# Patient Record
Sex: Male | Born: 1955 | Race: White | Hispanic: No | Marital: Married | Smoking: Former smoker
Health system: Southern US, Community
[De-identification: ages and names within clinical notes are randomized; demographics above are authoritative.]

## PROBLEM LIST (undated history)

## (undated) DIAGNOSIS — K219 Gastro-esophageal reflux disease without esophagitis: Secondary | ICD-10-CM

## (undated) DIAGNOSIS — Z9289 Personal history of other medical treatment: Secondary | ICD-10-CM

## (undated) DIAGNOSIS — I10 Essential (primary) hypertension: Secondary | ICD-10-CM

## (undated) DIAGNOSIS — G4730 Sleep apnea, unspecified: Secondary | ICD-10-CM

## (undated) DIAGNOSIS — R002 Palpitations: Secondary | ICD-10-CM

## (undated) DIAGNOSIS — F410 Panic disorder [episodic paroxysmal anxiety] without agoraphobia: Secondary | ICD-10-CM

## (undated) DIAGNOSIS — Z8719 Personal history of other diseases of the digestive system: Secondary | ICD-10-CM

## (undated) DIAGNOSIS — M1990 Unspecified osteoarthritis, unspecified site: Secondary | ICD-10-CM

## (undated) DIAGNOSIS — K5792 Diverticulitis of intestine, part unspecified, without perforation or abscess without bleeding: Secondary | ICD-10-CM

## (undated) DIAGNOSIS — F40240 Claustrophobia: Secondary | ICD-10-CM

## (undated) DIAGNOSIS — N189 Chronic kidney disease, unspecified: Secondary | ICD-10-CM

## (undated) HISTORY — PX: OTHER SURGICAL HISTORY: SHX169

## (undated) HISTORY — DX: Gastro-esophageal reflux disease without esophagitis: K21.9

## (undated) HISTORY — DX: Essential (primary) hypertension: I10

## (undated) HISTORY — PX: COLONOSCOPY: SHX174

## (undated) HISTORY — PX: NISSEN FUNDOPLICATION: SHX2091

## (undated) HISTORY — PX: APPENDECTOMY: SHX54

---

## 2013-08-26 ENCOUNTER — Ambulatory Visit (INDEPENDENT_AMBULATORY_CARE_PROVIDER_SITE_OTHER): Payer: BC Managed Care – HMO | Admitting: Surgery

## 2013-08-26 ENCOUNTER — Encounter (INDEPENDENT_AMBULATORY_CARE_PROVIDER_SITE_OTHER): Payer: Self-pay | Admitting: Surgery

## 2013-08-26 VITALS — BP 136/82 | HR 80 | Temp 97.5°F | Ht 72.0 in | Wt 202.0 lb

## 2013-08-26 DIAGNOSIS — K219 Gastro-esophageal reflux disease without esophagitis: Secondary | ICD-10-CM | POA: Insufficient documentation

## 2013-08-26 DIAGNOSIS — K449 Diaphragmatic hernia without obstruction or gangrene: Secondary | ICD-10-CM | POA: Insufficient documentation

## 2013-08-26 NOTE — Patient Instructions (Signed)
Will need to get operative note from [facility] for Nissen fundoplication in 1999 Would like to get a copy of the disk that has the UGI study from [facility].

## 2013-08-26 NOTE — Progress Notes (Signed)
Chief Complaint:  Dysphagia following a Nissen fundoplication in 1997 in [name]  History of Present Illness:  Eric Rollins is an 58 y.o. male who [facility] [place]. For what was anticipated to be an overnight stay in the duct and staying in the intensive care unit for 4 days. He was never told caused his ICU stay but he subsequently got along okay and had pain relief of his gastroesophageal reflux complaints. He had an episode of vomiting and retching and felt something tear and began having more problems with reflux. However over the last several months he's Crohn where he cannot eat a few bites and things seemed stacked up and come back on him. [facility] showed barium backing up and a recurrent hiatal hernia. There is a manometry from [facility] shows 70% of his wet swallows did not result in effective peristalsis.  It sounds as if he is got a recurrent hiatal hernia and a wrap at may be twisted causing obstruction and basically an iatrogenic achalasia. We need to get an upper endoscopy and look at things and I want to review his [facility] if possible. If that is not possible then I will repeat it. Also likely need a copy of his operative note from the [facility] in 1999 so we can review that prior to contemplating any interventional surgery.  [facility] is [facility] and [facility] but was referred to me by Levander CampionLacie Armistead, [facility].    Past Medical History  Diagnosis Date  . GERD (gastroesophageal reflux disease)   . Hypertension     Past Surgical History  Procedure Laterality Date  . Appendectomy    . Nissen fundoplication      Current Outpatient Prescriptions  Medication Sig Dispense Refill  . doxazosin (CARDURA) 1 MG tablet       . meloxicam (MOBIC) 7.5 MG tablet       . metoprolol succinate (TOPROL-XL) 100 MG 24  hr tablet       . NEXIUM 40 MG capsule       . triamterene-hydrochlorothiazide (MAXZIDE-25) 37.5-25 MG per tablet        No current facility-administered medications for this visit.   Penicillins History reviewed. No pertinent family history. Social History:   reports that he quit smoking about 23 years ago. His smoking use included Cigarettes. He smoked 0.00 packs per day. He does not have any smokeless tobacco history on file. He reports that he does not drink alcohol or use illicit drugs.   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 Body mass index is 27.39 kg/(m^2).  Gen:  WDWN WM NAD  Neurological: Alert and oriented to person, place, and time. Motor and sensory function is grossly intact  Head: Normocephalic and atraumatic.  Eyes: Conjunctivae are normal. Pupils are equal, round, and reactive to light. No scleral icterus.  Neck: Normal range of motion. Neck supple. No tracheal deviation or thyromegaly present.  Pscyh: Normal mood and affect. Behavior is normal. Judgment and thought content normal.   LABORATORY RESULTS: No results found for this or any previous visit (from the past 48 hour(s)).  RADIOLOGY RESULTS: No  results found.  Problem List: There are no active problems to display for this patient.   Assessment & Plan: Probable recurrent hiatal hernia with partially disrupted wrap producing dysphagia and esophageal dilatationb    [name], MD, [facility]  [facility] [phone] beeper [phone]  08/26/2013 2:12 PM

## 2013-09-15 ENCOUNTER — Encounter (HOSPITAL_COMMUNITY): Payer: Self-pay | Admitting: *Deleted

## 2013-09-15 ENCOUNTER — Ambulatory Visit (HOSPITAL_COMMUNITY)
Admission: RE | Admit: 2013-09-15 | Discharge: 2013-09-15 | Disposition: A | Discharge status: Home / Self Care | Payer: BC Managed Care – PPO | Source: Ambulatory Visit | Attending: Surgery | Admitting: Surgery

## 2013-09-15 ENCOUNTER — Encounter (HOSPITAL_COMMUNITY)
Admission: RE | Disposition: A | Discharge status: Home / Self Care | Payer: Self-pay | Source: Ambulatory Visit | Attending: Surgery

## 2013-09-15 DIAGNOSIS — A048 Other specified bacterial intestinal infections: Secondary | ICD-10-CM | POA: Insufficient documentation

## 2013-09-15 DIAGNOSIS — K219 Gastro-esophageal reflux disease without esophagitis: Secondary | ICD-10-CM | POA: Insufficient documentation

## 2013-09-15 DIAGNOSIS — Z87891 Personal history of nicotine dependence: Secondary | ICD-10-CM | POA: Insufficient documentation

## 2013-09-15 DIAGNOSIS — Z79899 Other long term (current) drug therapy: Secondary | ICD-10-CM | POA: Insufficient documentation

## 2013-09-15 DIAGNOSIS — K449 Diaphragmatic hernia without obstruction or gangrene: Secondary | ICD-10-CM | POA: Insufficient documentation

## 2013-09-15 DIAGNOSIS — R1310 Dysphagia, unspecified: Secondary | ICD-10-CM

## 2013-09-15 DIAGNOSIS — I10 Essential (primary) hypertension: Secondary | ICD-10-CM | POA: Insufficient documentation

## 2013-09-15 HISTORY — DX: Personal history of other diseases of the digestive system: Z87.19

## 2013-09-15 HISTORY — DX: Unspecified osteoarthritis, unspecified site: M19.90

## 2013-09-15 HISTORY — PX: ESOPHAGOGASTRODUODENOSCOPY: SHX5428

## 2013-09-15 HISTORY — DX: Sleep apnea, unspecified: G47.30

## 2013-09-15 SURGERY — EGD (ESOPHAGOGASTRODUODENOSCOPY)
Anesthesia: Moderate Sedation

## 2013-09-15 MED ORDER — BUTAMBEN-TETRACAINE-BENZOCAINE 2-2-14 % EX AERO
INHALATION_SPRAY | CUTANEOUS | Status: DC | PRN
  Administered 2013-09-15: 2 via TOPICAL

## 2013-09-15 MED ORDER — MIDAZOLAM HCL 10 MG/2ML IJ SOLN
INTRAMUSCULAR | Status: AC
  Filled 2013-09-15: qty 4

## 2013-09-15 MED ORDER — MIDAZOLAM HCL 10 MG/2ML IJ SOLN
INTRAMUSCULAR | Status: DC | PRN
  Administered 2013-09-15: 2 mg via INTRAVENOUS
  Administered 2013-09-15: 1 mg via INTRAVENOUS
  Administered 2013-09-15: 2 mg via INTRAVENOUS
  Administered 2013-09-15: 1 mg via INTRAVENOUS

## 2013-09-15 MED ORDER — SODIUM CHLORIDE 0.9 % IV SOLN
INTRAVENOUS | Status: DC
  Administered 2013-09-15: 500 mL via INTRAVENOUS

## 2013-09-15 MED ORDER — FENTANYL CITRATE 0.05 MG/ML IJ SOLN
INTRAMUSCULAR | Status: DC | PRN
  Administered 2013-09-15 (×3): 25 ug via INTRAVENOUS

## 2013-09-15 MED ORDER — FENTANYL CITRATE 0.05 MG/ML IJ SOLN
INTRAMUSCULAR | Status: AC
  Filled 2013-09-15: qty 4

## 2013-09-15 NOTE — Discharge Instructions (Signed)
Gastrointestinal Endoscopy Care After Refer to this sheet in the next few weeks. These instructions provide you with information on caring for yourself after your procedure. Your caregiver may also give you more specific instructions. Your treatment has been planned according to current medical practices, but problems sometimes occur. Call your caregiver if you have any problems or questions after your procedure. HOME CARE INSTRUCTIONS  If you were given medicine to help you relax (sedative), do not drive, operate machinery, or sign important documents for 24 hours.  Avoid alcohol and hot or warm beverages for the first 24 hours after the procedure.  Only take over-the-counter or prescription medicines for pain, discomfort, or fever as directed by your caregiver. You may resume taking your normal medicines unless your caregiver tells you otherwise. Ask your caregiver when you may resume taking medicines that may cause bleeding, such as aspirin, clopidogrel, or warfarin.  You may return to your normal diet and activities on the day after your procedure, or as directed by your caregiver. Walking may help to reduce any bloated feeling in your abdomen.  Drink enough fluids to keep your urine clear or pale yellow.  You may gargle with salt water if you have a sore throat. SEEK IMMEDIATE MEDICAL CARE IF:  You have severe nausea or vomiting.  You have severe abdominal pain, abdominal cramps that last longer than 6 hours, or abdominal swelling (distention).  You have severe shoulder or back pain.  You have trouble swallowing.  You have shortness of breath, your breathing is shallow, or you are breathing faster than normal.  You have a fever or a rapid heartbeat.  You vomit blood or material that looks like coffee grounds.  You have bloody, black, or tarry stools. MAKE SURE YOU:  Understand these instructions.  Will watch your condition.  Will get help right away if you are not doing  well or get worse. Document Released: 11/15/2003 Document Revised: 10/02/2011 Document Reviewed: 07/03/2011 [facility] Patient Information 2014 [place], [place]. Diet for Gastroesophageal Reflux Disease, Adult Reflux (acid reflux) is when acid from your stomach flows up into the esophagus. When acid comes in contact with the esophagus, the acid causes irritation and soreness (inflammation) in the esophagus. When reflux happens often or so severely that it causes damage to the esophagus, it is called gastroesophageal reflux disease (GERD). Nutrition therapy can help ease the discomfort of GERD. FOODS OR DRINKS TO AVOID OR LIMIT  Smoking or chewing tobacco. Nicotine is one of the most potent stimulants to acid production in the gastrointestinal tract.  Caffeinated and decaffeinated coffee and black tea.  Regular or low-calorie carbonated beverages or energy drinks (caffeine-free carbonated beverages are allowed).   Strong spices, such as black pepper, white pepper, red pepper, cayenne, curry powder, and chili powder.  Peppermint or spearmint.  Chocolate.  High-fat foods, including meats and fried foods. Extra added fats including oils, butter, salad dressings, and nuts. Limit these to less than 8 tsp per day.  Fruits and vegetables if they are not tolerated, such as citrus fruits or tomatoes.  Alcohol.  Any food that seems to aggravate your condition. If you have questions regarding your diet, call your caregiver or a registered dietitian. OTHER THINGS THAT MAY HELP GERD INCLUDE:   Eating your meals slowly, in a relaxed setting.  Eating 5 to 6 small meals per day instead of 3 large meals.  Eliminating food for a period of time if it causes distress.  Not lying down until 3 hours  after eating a meal.  Keeping the head of your bed raised 6 to 9 inches (15 to 23 cm) by using a foam wedge or blocks under the legs of the bed. Lying flat may make symptoms worse.  Being physically active.  Weight loss may be helpful in reducing reflux in overweight or obese adults.  Wear loose fitting clothing EXAMPLE MEAL PLAN This meal plan is approximately 2,000 calories based on https://www.bernard.org/ meal planning guidelines. Breakfast   cup cooked oatmeal.  1 cup strawberries.  1 cup low-fat milk.  1 oz almonds. Snack  1 cup cucumber slices.  6 oz yogurt (made from low-fat or fat-free milk). Lunch  2 slice whole-wheat bread.  2 oz sliced [place].  2 tsp mayonnaise.  1 cup blueberries.  1 cup snap peas. Snack  6 whole-wheat crackers.  1 oz string cheese. Dinner   cup brown rice.  1 cup mixed veggies.  1 tsp olive oil.  3 oz grilled fish. Document Released: 04/02/2005 Document Revised: 06/25/2011 Document Reviewed: 02/16/2011 [facility] Patient Information 2014 [place], [place]. Gastroesophageal Reflux Disease, Adult Gastroesophageal reflux disease (GERD) happens when acid from your stomach goes into your food pipe (esophagus). The acid can cause a burning feeling in your chest. Over time, the acid can make small holes (ulcers) in your food pipe.  HOME CARE  Ask your doctor for advice about:  Losing weight.  Quitting smoking.  Alcohol use.  Avoid foods and drinks that make your problems worse. You may want to avoid:  Caffeine and alcohol.  Chocolate.  Mints.  Garlic and onions.  Spicy foods.  Citrus fruits, such as oranges, lemons, or limes.  Foods that contain tomato, such as sauce, chili, salsa, and pizza.  Fried and fatty foods.  Avoid lying down for 3 hours before you go to bed or before you take a nap.  Eat small meals often, instead of large meals.  Wear loose-fitting clothing. Do not wear anything tight around your waist.  Raise (elevate) the head of your bed 6 to 8 inches with wood blocks. Using extra pillows does not help.  Only take medicines as told by your doctor.  Do not take aspirin or ibuprofen. GET HELP RIGHT AWAY IF:     You have pain in your arms, neck, jaw, teeth, or back.  Your pain gets worse or changes.  You feel sick to your stomach (nauseous), throw up (vomit), or sweat (diaphoresis).  You feel short of breath, or you pass out (faint).  Your throw up is green, yellow, black, or looks like coffee grounds or blood.  Your poop (stool) is red, bloody, or black. MAKE SURE YOU:   Understand these instructions.  Will watch your condition.  Will get help right away if you are not doing well or get worse. Document Released: 09/19/2007 Document Revised: 06/25/2011 Document Reviewed: 10/20/2010 [facility] Patient Information 2014 [place], [place].

## 2013-09-15 NOTE — H&P (View-Only) (Signed)
Chief Complaint:  Dysphagia following a Nissen fundoplication in 1997 in [name]  History of Present Illness:  Eric Rollins is an 58 y.o. male who [facility] [place] For what was anticipated to be an overnight stay in the duct and staying in the intensive care unit for 4 days. He was never told caused his ICU stay but he subsequently got along okay and had pain relief of his gastroesophageal reflux complaints. He had an episode of vomiting and retching and felt something tear and began having more problems with reflux. However over the last several months he's Crohn where he cannot eat a few bites and things seemed stacked up and come back on him. [facility] showed barium backing up and a recurrent hiatal hernia. There is a manometry from [facility] shows 70% of his wet swallows did not result in effective peristalsis.  It sounds as if he is got a recurrent hiatal hernia and a wrap at may be twisted causing obstruction and basically an iatrogenic achalasia. We need to get an upper endoscopy and look at things and I want to review [facility] if possible. If that is not possible then I will repeat it. Also likely need a copy of his operative note from the [facility] Hospital in 1999 so we can review that prior to contemplating any interventional surgery.  [facility] is [facility] and [facility] but was referred to me by [name], CRNA at [facility].    Past Medical History  Diagnosis Date  . GERD (gastroesophageal reflux disease)   . Hypertension     Past Surgical History  Procedure Laterality Date  . Appendectomy    . Nissen fundoplication      Current Outpatient Prescriptions  Medication Sig Dispense Refill  . doxazosin (CARDURA) 1 MG tablet       . meloxicam (MOBIC) 7.5 MG tablet       . metoprolol succinate (TOPROL-XL) 100 MG 24  hr tablet       . NEXIUM 40 MG capsule       . triamterene-hydrochlorothiazide (MAXZIDE-25) 37.5-25 MG per tablet        No current facility-administered medications for this visit.   Penicillins History reviewed. No pertinent family history. Social History:   reports that he quit smoking about 23 years ago. His smoking use included Cigarettes. He smoked 0.00 packs per day. He does not have any smokeless tobacco history on file. He reports that he does not drink alcohol or use illicit drugs.   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 Body mass index is 27.39 kg/(m^2).  Gen:  WDWN WM NAD  Neurological: Alert and oriented to person, place, and time. Motor and sensory function is grossly intact  Head: Normocephalic and atraumatic.  Eyes: Conjunctivae are normal. Pupils are equal, round, and reactive to light. No scleral icterus.  Neck: Normal range of motion. Neck supple. No tracheal deviation or thyromegaly present.  Pscyh: Normal mood and affect. Behavior is normal. Judgment and thought content normal.   LABORATORY RESULTS: No results found for this or any previous visit (from the past 48 hour(s)).  RADIOLOGY RESULTS: No  results found.  Problem List: There are no active problems to display for this patient.   Assessment & Plan: Probable recurrent hiatal hernia with partially disrupted wrap producing dysphagia and esophageal dilatationb    [name], MD, [facility]  [facility] [phone] beeper [phone]  08/26/2013 2:12 PM

## 2013-09-15 NOTE — Interval H&P Note (Signed)
History and Physical Interval Note:  09/15/2013 7:37 AM  Eric Rollins  has presented today for surgery, with the diagnosis of dysphagia after nissen   The various methods of treatment have been discussed with the patient and family.  The patient has his wife with him.  His biggest complaint is increased difficulty swallowing, but he has not lost weight.  After consideration of risks, benefits and other options for treatment, the patient has consented to  Procedure(s): ESOPHAGOGASTRODUODENOSCOPY (EGD) (N/A) as a surgical intervention .  The patient's history has been reviewed, patient examined, no change in status, stable for surgery.  I have reviewed the patient's chart and labs.  Questions were answered to the patient's satisfaction.     [name]

## 2013-09-15 NOTE — Op Note (Addendum)
09/15/2013  8:07 AM  PATIENT:  Eric Rollins, 58 y.o., male, MRN: [id number]  PREOP DIAGNOSIS:  History of prior hiatal hernia repair with dysphagia  POSTOP DIAGNOSIS:   "Slipped" hiatal hernia repair, EG junction at 35 cm, Diaphragmatic hiatus at 40 cm, mildly dilated distal esophagus  [photos taken]  PROCEDURE:  Esophagogastroduedonoscopy  SURGEON:   Ovidio Kin, M.D.  ANESTHESIA:   Fentanyl  75 mcg   Versed 6 mg  INDICATIONS FOR PROCEDURE:  Eric Rollins is a 58 y.o. (DOB: 1955/06/30)  white  male whose primary care physician is Hope Pigeon B and comes for upper endoscopy to evaluate a recurrent hiatal hernia.  He had a prior hiatal hernia repair around 1997 in [place].  He did well until about 3 years ago when he has noticed increasing dysphagia.  He comes for upper endo to evaluate his prior repair.   The indications and risks of the endoscopy were explained to the patient.  The risks include, but are not limited to, perforation, bleeding, or injury to the bowel.  PROCEDURE:  The patient was monitored with a pulse oximetry, BP cuff, and EKG.  The patient has nasal O2 flowing during the procedure.   The back of the throat was anesthestized with Ceticaine.  A flexible Pentax endoscope was passed down the throat without difficulty.  Findings include:   Esophagus:   Normal, except becomes mildly dilated distally as it approaches the Z line.  This would be consistent with partial obstruction at the diaphragmatic hiatus.  The diaphragmatic hiatus though is widely patent.  He had this constant burping while I was infusing air in the esophagus, which disappeared when I advanced the scope into the stomach.   GE junction at:  35 cm.  He has an approximate 5 cm hiatal hernia with the diaphragmatic hiatus at about 40 cm.  The diaphragmatic hiatus is widely patent (at least the opening width of twice the endoscope diameter).   Stomach: Some bland cobbling of the proximal stomach (does it  slide through the hiatus ?).  I did a biopsy for CLO.  Normal antrum and pylorus   Duodenum:   Normal  PLAN:  Return to see Dr. Daphine Deutscher to discuss the options of surgery.  Ovidio Kin, MD, [facility] Pager: [phone] Office phone:  [phone]

## 2013-09-16 LAB — CLOTEST (H. PYLORI), BIOPSY: Helicobacter screen: NEGATIVE

## 2013-09-17 ENCOUNTER — Encounter (HOSPITAL_COMMUNITY): Payer: Self-pay | Admitting: Surgery

## 2013-09-23 ENCOUNTER — Ambulatory Visit (INDEPENDENT_AMBULATORY_CARE_PROVIDER_SITE_OTHER): Payer: BC Managed Care – HMO | Admitting: Surgery

## 2013-09-23 VITALS — Temp 98.0°F | Resp 18 | Ht 72.0 in | Wt 198.0 lb

## 2013-09-23 DIAGNOSIS — K929 Disease of digestive system, unspecified: Secondary | ICD-10-CM

## 2013-09-23 DIAGNOSIS — K9189 Other postprocedural complications and disorders of digestive system: Secondary | ICD-10-CM

## 2013-09-23 DIAGNOSIS — K219 Gastro-esophageal reflux disease without esophagitis: Secondary | ICD-10-CM

## 2013-09-23 NOTE — Progress Notes (Signed)
Chief Complaint:  Dysphagia following a Nissen fundoplication in 1997 in [name]  History of Present Illness:  Eric Rollins is an 58 y.o. male who underwent a Nissen fundoplication in 1997 at [facility] in [place]. For what was anticipated to be an overnight stay in the hospital turned in to a 4 day stay in  intensive care.  I reviewed his operative note and there was no indication of any problem with the Nissen fundoplication. He was never told caused his ICU stay but he subsequently got along okay and had pain relief of his gastroesophageal reflux complaints. He had an episode of vomiting and retching and felt something tear and began having more problems with reflux. However over the last several months he's gotten where he cannot eat a few bites and things seemed stacked up and come back on him. [facility] showed barium backing up and a recurrent hiatal hernia. There is a manometry from [facility] shows 70% of his wet swallows did not result in effective peristalsis.  It sounds as if he is got a recurrent hiatal hernia and a wrap at may be twisted causing obstruction and basically an iatrogenic achalasia. We need to get an upper endoscopy and look at things and I want to review his [facility] if possible. If that is not possible then I will repeat it. Also likely need a copy of his operative note from the regional Hospital in 1999 so we can review that prior to contemplating any interventional surgery.  [facility] is [facility] and [facility] but was referred to me by [name], CRNA at [facility].    A reviewed his upper endoscopy with Dr. Ezzard Standing who performed. It would appear that his wrap has herniated above his diaphragm. I discussed recurrent surgery with him and he would like to get that done although his wife is facing some pending severe [facility]  I think we will try set up a redo Nissen fundoplication in early August I will get Dr. Ezzard Standing to assist me.  Past Medical History  Diagnosis Date  . GERD (gastroesophageal reflux disease)   . Hypertension   . Murmur   . Pneumonia   . Arthritis     in back  . H/O hiatal hernia   . Sleep apnea     sleep apenia surgery that was unsuccessful    Past Surgical History  Procedure Laterality Date  . Appendectomy    . Nissen fundoplication    . Esophagogastroduodenoscopy N/A 09/15/2013    Procedure: ESOPHAGOGASTRODUODENOSCOPY (EGD);  Surgeon: [name], MD;  Location: [facility];  Service: General;  Laterality: N/A;    Current Outpatient Prescriptions  Medication Sig Dispense Refill  . doxazosin (CARDURA) 1 MG tablet       . ibuprofen (ADVIL,MOTRIN) 200 MG tablet Take 200 mg by mouth 4 (four) times daily.      . meloxicam (MOBIC) 7.5 MG tablet       . metoprolol succinate (TOPROL-XL) 100 MG 24 hr tablet       . NEXIUM 40 MG capsule       . triamterene-hydrochlorothiazide (MAXZIDE-25) 37.5-25 MG per tablet        No current facility-administered medications for this visit.   Lisinopril; Oxycodone; and Penicillins No family history on file. Social History:   reports that he quit smoking about 23  years ago. His smoking use included Cigarettes. He smoked 0.00 packs per day. He has never used smokeless tobacco. He reports that he does not drink alcohol or use illicit drugs.   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 rate 18, height 6' (1.829 m), weight 198 lb (89.812 kg). Body mass index is 26.85 kg/(m^2).  Gen:  WDWN WM NAD  Neurological: Alert and oriented to person, place, and time. Motor and sensory function is grossly intact  Head: Normocephalic and atraumatic.  Eyes: Conjunctivae are normal. Pupils are equal, round, and reactive to light. No  scleral icterus.  Neck: Normal range of motion. Neck supple. No tracheal deviation or thyromegaly present.  Pscyh: Normal mood and affect. Behavior is normal. Judgment and thought content normal.   LABORATORY RESULTS: No results found for this or any previous visit (from the past 48 hour(s)).  RADIOLOGY RESULTS: No results found.  Problem List: Patient Active Problem List   Diagnosis Date Noted  . GERD (gastroesophageal reflux disease) 08/26/2013  . Hiatus hernia syndrome 08/26/2013    Assessment & Plan: Probable recurrent hiatal hernia with partially disrupted wrap producing dysphagia and esophageal dilatation.  Plan redo laparoscopic Nissen fundoplication.      [name], MD, [facility]  [facility] [phone] beeper [phone]  09/23/2013 1:16 PM

## 2013-09-25 DIAGNOSIS — K9189 Other postprocedural complications and disorders of digestive system: Secondary | ICD-10-CM | POA: Insufficient documentation

## 2013-09-29 ENCOUNTER — Encounter (INDEPENDENT_AMBULATORY_CARE_PROVIDER_SITE_OTHER): Payer: Self-pay

## 2013-10-09 ENCOUNTER — Encounter (INDEPENDENT_AMBULATORY_CARE_PROVIDER_SITE_OTHER): Payer: BC Managed Care – HMO | Admitting: Surgery

## 2013-10-23 ENCOUNTER — Encounter (INDEPENDENT_AMBULATORY_CARE_PROVIDER_SITE_OTHER): Payer: BC Managed Care – HMO | Admitting: Surgery

## 2013-11-20 ENCOUNTER — Encounter (HOSPITAL_COMMUNITY): Payer: Self-pay | Admitting: Pharmacy Technician

## 2013-11-25 NOTE — Patient Instructions (Addendum)
Eric Rollins  11/25/2013                           YOUR PROCEDURE IS SCHEDULED ON: 12/04/13               ENTER THRU [facility] MAIN [facility]OSPITAL ENTRANCE AND                            FOLLOW  SIGNS TO SHORT STAY CENTER                 ARRIVE AT SHORT STAY AT: 5:30 AM               CALL THIS NUMBER IF ANY PROBLEMS THE DAY OF SURGERY :               [phone]                                REMEMBER:   Do not eat food or drink liquids AFTER MIDNIGHT              USE FLEET ENEMA THE NIGHT BEFORE SURGERY              STOP ALL ASPIRIN AND HERBAL MEDS 5 DAYS PREOP               BRING ASV MACHINE TO HOSPITAL             Take these medicines the morning of surgery with               A SIPS OF WATER :  LORAZEPAM / METOPROLOL / NEXIUM       Do not wear jewelry, make-up   Do not wear lotions, powders, or perfumes.   Do not shave legs or underarms 12 hrs. before surgery (men may shave face)  Do not bring valuables to the hospital.  Contacts, dentures or bridgework may not be worn into surgery.  Leave suitcase in the car. After surgery it may be brought to your room.  For patients admitted to the hospital more than one night, checkout time is            11:00 AM                                                         ________________________________________________________________________                                                                        [facility] - PREPARING FOR SURGERY  Before surgery, you can play an important role.  Because skin is not sterile, your skin needs to be as free of germs as possible.  You can reduce the number of germs on your skin by washing with CHG (chlorahexidine gluconate) soap before surgery.  CHG is an antiseptic cleaner which kills germs and bonds with the skin to continue killing germs even  after washing. Please DO NOT use if you have an allergy to CHG or antibacterial soaps.  If your skin becomes reddened/irritated stop using  the CHG and inform your nurse when you arrive at Short Stay. Do not shave (including legs and underarms) for at least 48 hours prior to the first CHG shower.  You may shave your face. Please follow these instructions carefully:   1.  Shower with CHG Soap the night before surgery and the  morning of Surgery.   2.  If you choose to wash your hair, wash your hair first as usual with your  normal  Shampoo.   3.  After you shampoo, rinse your hair and body thoroughly to remove the  shampoo.                                         4.  Use CHG as you would any other liquid soap.  You can apply chg directly  to the skin and wash . Gently wash with scrungie or clean wascloth    5.  Apply the CHG Soap to your body ONLY FROM THE NECK DOWN.   Do not use on open                           Wound or open sores. Avoid contact with eyes, ears mouth and genitals (private parts).                        Genitals (private parts) with your normal soap.              6.  Wash thoroughly, paying special attention to the area where your surgery  will be performed.   7.  Thoroughly rinse your body with warm water from the neck down.   8.  DO NOT shower/wash with your normal soap after using and rinsing off  the CHG Soap .                9.  Pat yourself dry with a clean towel.             10.  Wear clean pajamas.             11.  Place clean sheets on your bed the night of your first shower and do not  sleep with pets.  Day of Surgery : Do not apply any lotions/deodorants the morning of surgery.  Please wear clean clothes to the hospital/surgery center.  FAILURE TO FOLLOW THESE INSTRUCTIONS MAY RESULT IN THE CANCELLATION OF YOUR SURGERY    PATIENT SIGNATURE_________________________________  ______________________________________________________________________

## 2013-11-26 ENCOUNTER — Encounter (HOSPITAL_COMMUNITY): Payer: Self-pay

## 2013-11-26 ENCOUNTER — Encounter (HOSPITAL_COMMUNITY)
Admission: RE | Admit: 2013-11-26 | Discharge: 2013-11-26 | Disposition: A | Discharge status: Home / Self Care | Payer: BC Managed Care – PPO | Source: Ambulatory Visit | Attending: Surgery | Admitting: Surgery

## 2013-11-26 DIAGNOSIS — Z01810 Encounter for preprocedural cardiovascular examination: Secondary | ICD-10-CM | POA: Insufficient documentation

## 2013-11-26 DIAGNOSIS — Z01818 Encounter for other preprocedural examination: Secondary | ICD-10-CM | POA: Insufficient documentation

## 2013-11-26 DIAGNOSIS — I498 Other specified cardiac arrhythmias: Secondary | ICD-10-CM | POA: Insufficient documentation

## 2013-11-26 DIAGNOSIS — Z01812 Encounter for preprocedural laboratory examination: Secondary | ICD-10-CM | POA: Diagnosis not present

## 2013-11-26 HISTORY — DX: Claustrophobia: F40.240

## 2013-11-26 HISTORY — DX: Chronic kidney disease, unspecified: N18.9

## 2013-11-26 HISTORY — DX: Palpitations: R00.2

## 2013-11-26 HISTORY — DX: Panic disorder (episodic paroxysmal anxiety): F41.0

## 2013-11-26 HISTORY — DX: Diverticulitis of intestine, part unspecified, without perforation or abscess without bleeding: K57.92

## 2013-11-26 LAB — BASIC METABOLIC PANEL
Anion gap: 12 (ref 5–15)
BUN: 14 mg/dL (ref 6–23)
CO2: 29 mEq/L (ref 19–32)
Calcium: 9.1 mg/dL (ref 8.4–10.5)
Chloride: 97 mEq/L (ref 96–112)
Creatinine, Ser: 1.51 mg/dL — ABNORMAL HIGH (ref 0.50–1.35)
GFR calc Af Amer: 58 mL/min — ABNORMAL LOW (ref >90)
GFR calc non Af Amer: 50 mL/min — ABNORMAL LOW (ref >90)
Glucose, Bld: 97 mg/dL (ref 70–99)
Potassium: 3.6 mEq/L — ABNORMAL LOW (ref 3.7–5.3)
Sodium: 138 mEq/L (ref 137–147)

## 2013-11-26 LAB — CBC
HCT: 40.7 % (ref 39.0–52.0)
Hemoglobin: 14.3 g/dL (ref 13.0–17.0)
MCH: 31.4 pg (ref 26.0–34.0)
MCHC: 35.1 g/dL (ref 30.0–36.0)
MCV: 89.3 fL (ref 78.0–100.0)
PLATELETS: 175 10*3/uL (ref 150–400)
RBC: 4.56 MIL/uL (ref 4.22–5.81)
RDW: 12.1 % (ref 11.5–15.5)
WBC: 4.5 10*3/uL (ref 4.0–10.5)

## 2013-11-27 NOTE — Progress Notes (Signed)
Abnormal BMET faxed to Dr. Daphine DeutscherMartin

## 2013-12-04 ENCOUNTER — Encounter (HOSPITAL_COMMUNITY): Payer: BC Managed Care – PPO | Admitting: *Deleted

## 2013-12-04 ENCOUNTER — Encounter (HOSPITAL_COMMUNITY): Payer: Self-pay | Admitting: *Deleted

## 2013-12-04 ENCOUNTER — Inpatient Hospital Stay (HOSPITAL_COMMUNITY)
Admission: RE | Admit: 2013-12-04 | Discharge: 2013-12-06 | DRG: 328 | Disposition: A | Discharge status: Home / Self Care | Payer: BC Managed Care – PPO | Source: Ambulatory Visit | Attending: Surgery | Admitting: Surgery

## 2013-12-04 ENCOUNTER — Encounter (HOSPITAL_COMMUNITY)
Admission: RE | Disposition: A | Discharge status: Home / Self Care | Payer: Self-pay | Source: Ambulatory Visit | Attending: Surgery

## 2013-12-04 ENCOUNTER — Inpatient Hospital Stay (HOSPITAL_COMMUNITY): Payer: BC Managed Care – PPO | Admitting: *Deleted

## 2013-12-04 DIAGNOSIS — K449 Diaphragmatic hernia without obstruction or gangrene: Secondary | ICD-10-CM | POA: Diagnosis present

## 2013-12-04 DIAGNOSIS — Y838 Other surgical procedures as the cause of abnormal reaction of the patient, or of later complication, without mention of misadventure at the time of the procedure: Secondary | ICD-10-CM | POA: Diagnosis present

## 2013-12-04 DIAGNOSIS — Z6826 Body mass index (BMI) 26.0-26.9, adult: Secondary | ICD-10-CM | POA: Diagnosis not present

## 2013-12-04 DIAGNOSIS — F410 Panic disorder [episodic paroxysmal anxiety] without agoraphobia: Secondary | ICD-10-CM | POA: Diagnosis present

## 2013-12-04 DIAGNOSIS — Z79899 Other long term (current) drug therapy: Secondary | ICD-10-CM | POA: Diagnosis not present

## 2013-12-04 DIAGNOSIS — G4730 Sleep apnea, unspecified: Secondary | ICD-10-CM | POA: Diagnosis present

## 2013-12-04 DIAGNOSIS — Z01812 Encounter for preprocedural laboratory examination: Secondary | ICD-10-CM

## 2013-12-04 DIAGNOSIS — F411 Generalized anxiety disorder: Secondary | ICD-10-CM | POA: Diagnosis present

## 2013-12-04 DIAGNOSIS — R1310 Dysphagia, unspecified: Secondary | ICD-10-CM

## 2013-12-04 DIAGNOSIS — Z9889 Other specified postprocedural states: Secondary | ICD-10-CM

## 2013-12-04 DIAGNOSIS — Z87891 Personal history of nicotine dependence: Secondary | ICD-10-CM | POA: Diagnosis not present

## 2013-12-04 DIAGNOSIS — K929 Disease of digestive system, unspecified: Principal | ICD-10-CM | POA: Diagnosis present

## 2013-12-04 DIAGNOSIS — K219 Gastro-esophageal reflux disease without esophagitis: Secondary | ICD-10-CM | POA: Diagnosis present

## 2013-12-04 DIAGNOSIS — N189 Chronic kidney disease, unspecified: Secondary | ICD-10-CM | POA: Diagnosis present

## 2013-12-04 DIAGNOSIS — I10 Essential (primary) hypertension: Secondary | ICD-10-CM | POA: Diagnosis present

## 2013-12-04 HISTORY — PX: LAPAROSCOPIC NISSEN FUNDOPLICATION: SHX1932

## 2013-12-04 LAB — CREATININE, SERUM
CREATININE: 1.34 mg/dL (ref 0.50–1.35)
GFR calc Af Amer: 67 mL/min — ABNORMAL LOW (ref >90)
GFR calc non Af Amer: 58 mL/min — ABNORMAL LOW (ref >90)

## 2013-12-04 LAB — CBC
HCT: 40 % (ref 39.0–52.0)
Hemoglobin: 14 g/dL (ref 13.0–17.0)
MCH: 31.5 pg (ref 26.0–34.0)
MCHC: 35 g/dL (ref 30.0–36.0)
MCV: 89.9 fL (ref 78.0–100.0)
PLATELETS: 153 10*3/uL (ref 150–400)
RBC: 4.45 MIL/uL (ref 4.22–5.81)
RDW: 12.2 % (ref 11.5–15.5)
WBC: 9.8 10*3/uL (ref 4.0–10.5)

## 2013-12-04 SURGERY — FUNDOPLICATION, NISSEN, LAPAROSCOPIC
Anesthesia: General

## 2013-12-04 MED ORDER — DEXTROSE 5 % IV SOLN
2.0000 g | INTRAVENOUS | Status: AC
  Administered 2013-12-04: 2 g via INTRAVENOUS

## 2013-12-04 MED ORDER — METOCLOPRAMIDE HCL 5 MG/ML IJ SOLN
INTRAMUSCULAR | Status: DC | PRN
  Administered 2013-12-04: 10 mg via INTRAVENOUS

## 2013-12-04 MED ORDER — LIDOCAINE HCL (CARDIAC) 20 MG/ML IV SOLN
INTRAVENOUS | Status: DC | PRN
  Administered 2013-12-04: 50 mg via INTRAVENOUS

## 2013-12-04 MED ORDER — NEOSTIGMINE METHYLSULFATE 10 MG/10ML IV SOLN
INTRAVENOUS | Status: AC
  Filled 2013-12-04: qty 1

## 2013-12-04 MED ORDER — EPHEDRINE SULFATE 50 MG/ML IJ SOLN
INTRAMUSCULAR | Status: DC | PRN
  Administered 2013-12-04: 10 mg via INTRAVENOUS
  Administered 2013-12-04: 5 mg via INTRAVENOUS

## 2013-12-04 MED ORDER — LIDOCAINE HCL (CARDIAC) 20 MG/ML IV SOLN
INTRAVENOUS | Status: AC
  Filled 2013-12-04: qty 5

## 2013-12-04 MED ORDER — FLEET ENEMA 7-19 GM/118ML RE ENEM: 1.0000 | ENEMA | Freq: Once | RECTAL | Status: DC

## 2013-12-04 MED ORDER — ONDANSETRON HCL 4 MG PO TABS: 4.0000 mg | ORAL_TABLET | Freq: Four times a day (QID) | ORAL | Status: DC | PRN

## 2013-12-04 MED ORDER — GLYCOPYRROLATE 0.2 MG/ML IJ SOLN
INTRAMUSCULAR | Status: DC | PRN
  Administered 2013-12-04: 0.2 mg via INTRAVENOUS
  Administered 2013-12-04: .8 mg via INTRAVENOUS

## 2013-12-04 MED ORDER — LORAZEPAM 2 MG/ML IJ SOLN
0.5000 mg | INTRAMUSCULAR | Status: DC | PRN
  Administered 2013-12-04 – 2013-12-05 (×3): 0.5 mg via INTRAVENOUS
  Filled 2013-12-04 (×3): qty 1

## 2013-12-04 MED ORDER — NEOSTIGMINE METHYLSULFATE 10 MG/10ML IV SOLN
INTRAVENOUS | Status: DC | PRN
  Administered 2013-12-04: 5 mg via INTRAVENOUS

## 2013-12-04 MED ORDER — LACTATED RINGERS IV SOLN
INTRAVENOUS | Status: DC | PRN
  Administered 2013-12-04: 1000 mL

## 2013-12-04 MED ORDER — ONDANSETRON HCL 4 MG/2ML IJ SOLN
INTRAMUSCULAR | Status: DC | PRN
  Administered 2013-12-04: 4 mg via INTRAVENOUS

## 2013-12-04 MED ORDER — SUCCINYLCHOLINE CHLORIDE 20 MG/ML IJ SOLN
INTRAMUSCULAR | Status: DC | PRN
  Administered 2013-12-04: 100 mg via INTRAVENOUS

## 2013-12-04 MED ORDER — SUFENTANIL CITRATE 50 MCG/ML IV SOLN
INTRAVENOUS | Status: DC | PRN
  Administered 2013-12-04: 5 ug via INTRAVENOUS
  Administered 2013-12-04 (×2): 10 ug via INTRAVENOUS
  Administered 2013-12-04 (×6): 5 ug via INTRAVENOUS
  Administered 2013-12-04: 10 ug via INTRAVENOUS

## 2013-12-04 MED ORDER — LACTATED RINGERS IV SOLN
INTRAVENOUS | Status: DC | PRN
  Administered 2013-12-04 (×3): via INTRAVENOUS

## 2013-12-04 MED ORDER — PROPOFOL 10 MG/ML IV BOLUS
INTRAVENOUS | Status: DC | PRN
  Administered 2013-12-04: 220 mg via INTRAVENOUS

## 2013-12-04 MED ORDER — CEFOXITIN SODIUM 2 G IV SOLR
INTRAVENOUS | Status: AC
  Filled 2013-12-04: qty 2

## 2013-12-04 MED ORDER — KCL IN DEXTROSE-NACL 20-5-0.45 MEQ/L-%-% IV SOLN
INTRAVENOUS | Status: DC
  Administered 2013-12-04 – 2013-12-05 (×2): via INTRAVENOUS
  Administered 2013-12-05: 100 mL/h via INTRAVENOUS
  Administered 2013-12-06 (×2): via INTRAVENOUS
  Filled 2013-12-04 (×5): qty 1000

## 2013-12-04 MED ORDER — ONDANSETRON HCL 4 MG/2ML IJ SOLN: 4.0000 mg | Freq: Four times a day (QID) | INTRAMUSCULAR | Status: DC | PRN

## 2013-12-04 MED ORDER — SODIUM CHLORIDE 0.9 % IJ SOLN
INTRAMUSCULAR | Status: AC
  Filled 2013-12-04: qty 20

## 2013-12-04 MED ORDER — PROMETHAZINE HCL 25 MG/ML IJ SOLN
6.2500 mg | INTRAMUSCULAR | Status: DC | PRN
  Administered 2013-12-04: 6.25 mg via INTRAVENOUS

## 2013-12-04 MED ORDER — SUFENTANIL CITRATE 50 MCG/ML IV SOLN
INTRAVENOUS | Status: AC
  Filled 2013-12-04: qty 1

## 2013-12-04 MED ORDER — PROMETHAZINE HCL 25 MG/ML IJ SOLN
INTRAMUSCULAR | Status: AC
  Filled 2013-12-04: qty 1

## 2013-12-04 MED ORDER — SODIUM CHLORIDE 0.9 % IJ SOLN: 9.0000 mL | INTRAMUSCULAR | Status: DC | PRN

## 2013-12-04 MED ORDER — PROPOFOL 10 MG/ML IV BOLUS
INTRAVENOUS | Status: AC
  Filled 2013-12-04: qty 20

## 2013-12-04 MED ORDER — CHLORHEXIDINE GLUCONATE 0.12 % MT SOLN
15.0000 mL | Freq: Two times a day (BID) | OROMUCOSAL | Status: DC
  Administered 2013-12-04 – 2013-12-06 (×5): 15 mL via OROMUCOSAL
  Filled 2013-12-04 (×7): qty 15

## 2013-12-04 MED ORDER — CHLORHEXIDINE GLUCONATE 4 % EX LIQD: 1.0000 "application " | Freq: Once | CUTANEOUS | Status: DC

## 2013-12-04 MED ORDER — MIDAZOLAM HCL 2 MG/2ML IJ SOLN
INTRAMUSCULAR | Status: AC
  Filled 2013-12-04: qty 2

## 2013-12-04 MED ORDER — HYDROMORPHONE 0.3 MG/ML IV SOLN
INTRAVENOUS | Status: AC
  Filled 2013-12-04: qty 25

## 2013-12-04 MED ORDER — DIPHENHYDRAMINE HCL 50 MG/ML IJ SOLN: 12.5000 mg | Freq: Four times a day (QID) | INTRAMUSCULAR | Status: DC | PRN

## 2013-12-04 MED ORDER — HEPARIN SODIUM (PORCINE) 5000 UNIT/ML IJ SOLN
5000.0000 [IU] | Freq: Three times a day (TID) | INTRAMUSCULAR | Status: DC
  Administered 2013-12-04 – 2013-12-06 (×5): 5000 [IU] via SUBCUTANEOUS
  Filled 2013-12-04 (×9): qty 1

## 2013-12-04 MED ORDER — PHENYLEPHRINE HCL 10 MG/ML IJ SOLN
INTRAMUSCULAR | Status: DC | PRN
  Administered 2013-12-04: 80 ug via INTRAVENOUS

## 2013-12-04 MED ORDER — CISATRACURIUM BESYLATE (PF) 10 MG/5ML IV SOLN
INTRAVENOUS | Status: DC | PRN
  Administered 2013-12-04: 6 mg via INTRAVENOUS
  Administered 2013-12-04 (×2): 4 mg via INTRAVENOUS

## 2013-12-04 MED ORDER — FENTANYL CITRATE 0.05 MG/ML IJ SOLN
INTRAMUSCULAR | Status: AC
  Filled 2013-12-04: qty 2

## 2013-12-04 MED ORDER — BUPIVACAINE LIPOSOME 1.3 % IJ SUSP
20.0000 mL | Freq: Once | INTRAMUSCULAR | Status: AC
Start: 2013-12-04 — End: 2013-12-04
  Administered 2013-12-04: 20 mL
  Filled 2013-12-04: qty 20

## 2013-12-04 MED ORDER — GLYCOPYRROLATE 0.2 MG/ML IJ SOLN
INTRAMUSCULAR | Status: AC
  Filled 2013-12-04: qty 4

## 2013-12-04 MED ORDER — CETYLPYRIDINIUM CHLORIDE 0.05 % MT LIQD
7.0000 mL | Freq: Two times a day (BID) | OROMUCOSAL | Status: DC
  Administered 2013-12-04 – 2013-12-05 (×2): 7 mL via OROMUCOSAL

## 2013-12-04 MED ORDER — HYDROMORPHONE HCL PF 1 MG/ML IJ SOLN
INTRAMUSCULAR | Status: DC | PRN
  Administered 2013-12-04 (×4): 0.5 mg via INTRAVENOUS

## 2013-12-04 MED ORDER — LACTATED RINGERS IV SOLN: INTRAVENOUS | Status: DC

## 2013-12-04 MED ORDER — FENTANYL CITRATE 0.05 MG/ML IJ SOLN
25.0000 ug | INTRAMUSCULAR | Status: DC | PRN
  Administered 2013-12-04 (×2): 25 ug via INTRAVENOUS

## 2013-12-04 MED ORDER — NALOXONE HCL 0.4 MG/ML IJ SOLN: 0.4000 mg | INTRAMUSCULAR | Status: DC | PRN

## 2013-12-04 MED ORDER — ONDANSETRON HCL 4 MG/2ML IJ SOLN
INTRAMUSCULAR | Status: AC
  Filled 2013-12-04: qty 2

## 2013-12-04 MED ORDER — 0.9 % SODIUM CHLORIDE (POUR BTL) OPTIME
TOPICAL | Status: DC | PRN
  Administered 2013-12-04: 1000 mL

## 2013-12-04 MED ORDER — HYDROMORPHONE 0.3 MG/ML IV SOLN
INTRAVENOUS | Status: DC
  Administered 2013-12-04: 0.2 mg via INTRAVENOUS
  Administered 2013-12-04: 0.599 mg via INTRAVENOUS
  Administered 2013-12-04: 12:00:00 via INTRAVENOUS
  Administered 2013-12-05: 0.599 mg via INTRAVENOUS
  Administered 2013-12-05: 0.2 mg via INTRAVENOUS
  Administered 2013-12-05: 0.4 mg via INTRAVENOUS
  Administered 2013-12-06: 0.19 mg via INTRAVENOUS
  Administered 2013-12-06: 0.4 mg via INTRAVENOUS

## 2013-12-04 MED ORDER — MIDAZOLAM HCL 5 MG/5ML IJ SOLN
INTRAMUSCULAR | Status: DC | PRN
  Administered 2013-12-04: 1 mg via INTRAVENOUS
  Administered 2013-12-04: 2 mg via INTRAVENOUS
  Administered 2013-12-04: 1 mg via INTRAVENOUS

## 2013-12-04 MED ORDER — MEPERIDINE HCL 50 MG/ML IJ SOLN: 6.2500 mg | INTRAMUSCULAR | Status: DC | PRN

## 2013-12-04 MED ORDER — DEXAMETHASONE SODIUM PHOSPHATE 10 MG/ML IJ SOLN
INTRAMUSCULAR | Status: DC | PRN
  Administered 2013-12-04: 10 mg via INTRAVENOUS

## 2013-12-04 MED ORDER — SODIUM CHLORIDE 0.9 % IJ SOLN
INTRAMUSCULAR | Status: AC
  Filled 2013-12-04: qty 10

## 2013-12-04 MED ORDER — HYDROMORPHONE HCL PF 2 MG/ML IJ SOLN
INTRAMUSCULAR | Status: AC
  Filled 2013-12-04: qty 1

## 2013-12-04 MED ORDER — LORAZEPAM BOLUS VIA INFUSION: 0.5000 mg | INTRAVENOUS | Status: DC | PRN

## 2013-12-04 MED ORDER — DIPHENHYDRAMINE HCL 12.5 MG/5ML PO ELIX: 12.5000 mg | ORAL_SOLUTION | Freq: Four times a day (QID) | ORAL | Status: DC | PRN

## 2013-12-04 MED ORDER — HEPARIN SODIUM (PORCINE) 5000 UNIT/ML IJ SOLN
5000.0000 [IU] | Freq: Once | INTRAMUSCULAR | Status: AC
  Administered 2013-12-04: 5000 [IU] via SUBCUTANEOUS
  Filled 2013-12-04: qty 1

## 2013-12-04 MED ORDER — ROCURONIUM BROMIDE 100 MG/10ML IV SOLN
INTRAVENOUS | Status: DC | PRN
  Administered 2013-12-04: 5 mg via INTRAVENOUS

## 2013-12-04 MED ORDER — EPHEDRINE SULFATE 50 MG/ML IJ SOLN
INTRAMUSCULAR | Status: AC
  Filled 2013-12-04: qty 1

## 2013-12-04 SURGICAL SUPPLY — 48 items
APPLIER CLIP ROT 10 11.4 M/L (STAPLE)
BENZOIN TINCTURE PRP APPL 2/3 (GAUZE/BANDAGES/DRESSINGS) IMPLANT
CABLE HIGH FREQUENCY MONO STRZ (ELECTRODE) IMPLANT
CLAMP ENDO BABCK 10MM (STAPLE) IMPLANT
CLIP APPLIE ROT 10 11.4 M/L (STAPLE) IMPLANT
CLOSURE WOUND 1/2 X4 (GAUZE/BANDAGES/DRESSINGS)
DECANTER SPIKE VIAL GLASS SM (MISCELLANEOUS) ×3 IMPLANT
DEVICE SUT QUICK LOAD TK 5 (STAPLE) ×12 IMPLANT
DEVICE SUT TI-KNOT TK 5X26 (MISCELLANEOUS) ×2 IMPLANT
DEVICE SUTURE ENDOST 10MM (ENDOMECHANICALS) IMPLANT
DEVICE TI KNOT TK5 (MISCELLANEOUS) ×1
DISSECTOR BLUNT TIP ENDO 5MM (MISCELLANEOUS) ×3 IMPLANT
DRAIN PENROSE 18X1/2 LTX STRL (DRAIN) ×3 IMPLANT
DRAPE LAPAROSCOPIC ABDOMINAL (DRAPES) ×3 IMPLANT
ELECT REM PT RETURN 9FT ADLT (ELECTROSURGICAL) ×3
ELECTRODE REM PT RTRN 9FT ADLT (ELECTROSURGICAL) ×1 IMPLANT
FILTER SMOKE EVAC LAPAROSHD (FILTER) IMPLANT
GLOVE BIOGEL M 8.0 STRL (GLOVE) ×3 IMPLANT
GOWN SPEC L4 XLG W/TWL (GOWN DISPOSABLE) ×6 IMPLANT
GOWN STRL REUS W/TWL XL LVL3 (GOWN DISPOSABLE) ×9 IMPLANT
GRASPER ENDO BABCOCK 10 (MISCELLANEOUS) IMPLANT
GRASPER ENDO BABCOCK 10MM (MISCELLANEOUS)
KIT BASIN OR (CUSTOM PROCEDURE TRAY) ×3 IMPLANT
PENCIL BUTTON HOLSTER BLD 10FT (ELECTRODE) IMPLANT
QUICK LOAD TK 5 (STAPLE) ×6
SCISSORS LAP 5X45 EPIX DISP (ENDOMECHANICALS) ×3 IMPLANT
SET IRRIG TUBING LAPAROSCOPIC (IRRIGATION / IRRIGATOR) ×3 IMPLANT
SHEARS HARMONIC ACE PLUS 45CM (MISCELLANEOUS) ×3 IMPLANT
SLEEVE ADV FIXATION 12X100MM (TROCAR) IMPLANT
SLEEVE ADV FIXATION 5X100MM (TROCAR) ×3 IMPLANT
SOLUTION ANTI FOG 6CC (MISCELLANEOUS) ×3 IMPLANT
STAPLER VISISTAT 35W (STAPLE) ×6 IMPLANT
STRIP CLOSURE SKIN 1/2X4 (GAUZE/BANDAGES/DRESSINGS) IMPLANT
SUT SURGIDAC NAB ES-9 0 48 120 (SUTURE) ×18 IMPLANT
SUT VIC AB 4-0 SH 18 (SUTURE) ×3 IMPLANT
TIP INNERVISION DETACH 40FR (MISCELLANEOUS) IMPLANT
TIP INNERVISION DETACH 50FR (MISCELLANEOUS) IMPLANT
TIP INNERVISION DETACH 56FR (MISCELLANEOUS) ×3 IMPLANT
TIPS INNERVISION DETACH 40FR (MISCELLANEOUS)
TOWEL OR 17X26 10 PK STRL BLUE (TOWEL DISPOSABLE) ×6 IMPLANT
TOWEL OR NON WOVEN STRL DISP B (DISPOSABLE) ×3 IMPLANT
TRAY FOLEY CATH 14FRSI W/METER (CATHETERS) IMPLANT
TRAY FOLEY CATH 16FRSI W/METER (SET/KITS/TRAYS/PACK) ×3 IMPLANT
TRAY LAP CHOLE (CUSTOM PROCEDURE TRAY) ×3 IMPLANT
TROCAR ADV FIXATION 12X100MM (TROCAR) ×3 IMPLANT
TROCAR ADV FIXATION 5X100MM (TROCAR) ×6 IMPLANT
TROCAR BLADELESS OPT 5 100 (ENDOMECHANICALS) ×3 IMPLANT
TUBING FILTER THERMOFLATOR (ELECTROSURGICAL) ×3 IMPLANT

## 2013-12-04 NOTE — H&P (Signed)
Chief Complaint: Dysphagia following a Nissen fundoplication in 1997 in [place]  Primary care/[name]  History of Present Illness: Eric Rollins is an 58 y.o. male who underwent a Nissen fundoplication in [facility] at [facility] in [place]. For what was anticipated to be an overnight stay in the hospital turned in to a 4 day stay in intensive care. I reviewed his operative note and there was no indication of any problem with the Nissen fundoplication. He was never told caused his ICU stay but he subsequently got along okay and had pain relief of his gastroesophageal reflux complaints. He had an episode of vomiting and retching and felt something tear and began having more problems with reflux. However over the last several months he's gotten where he cannot eat a few bites and things seemed stacked up and come back on him. [facility] showed barium backing up and a recurrent hiatal hernia. There is a [facility] shows 70% of his wet swallows did not result in effective peristalsis.  It sounds as if he is got a recurrent hiatal hernia and a wrap at may be twisted causing obstruction and basically an iatrogenic achalasia. We need to get an upper endoscopy and look at things and I want to review his upper GI from [facility] if possible. If that is not possible then I will repeat it. Also likely need a copy of his operative note from the [facility] in 1999 so we can review that prior to contemplating any interventional surgery.  He is followed and [facility] but was referred to me by Levander CampionLacie Armistead[facility].  A reviewed his upper endoscopy with Dr. Ezzard StandingNewman who performed. It would appear that his wrap has herniated above his diaphragm. I discussed recurrent surgery with him and he would like to get that done although his wife is facing some pending severe of [facility].  I think we will try set up a redo Nissen fundoplication in early August I will get Dr. Ezzard StandingNewman to assist me.  Past Medical History   Diagnosis  Date   .  GERD (gastroesophageal reflux disease)    .  Hypertension    .  Murmur    .  Pneumonia    .  Arthritis      in back   .  H/O hiatal hernia    .  Sleep apnea      sleep apenia surgery that was unsuccessful    Past Surgical History   Procedure  Laterality  Date   .  Appendectomy     .  Nissen fundoplication     .  Esophagogastroduodenoscopy  N/A  09/15/2013     Procedure: ESOPHAGOGASTRODUODENOSCOPY (EGD); Surgeon: Kandis Cockingavid H Newman, MD; [facility]; Service: General; Laterality: N/A;    Current Outpatient Prescriptions   Medication  Sig  Dispense  Refill   .  doxazosin (CARDURA) 1 MG tablet      .  ibuprofen (ADVIL,MOTRIN) 200 MG tablet  Take 200 mg by mouth 4 (four) times daily.     .  meloxicam (MOBIC) 7.5 MG tablet      .  metoprolol succinate (TOPROL-XL) 100 MG 24 hr tablet      .  NEXIUM 40 MG capsule      .  triamterene-hydrochlorothiazide (MAXZIDE-25) 37.5-25 MG per tablet       No current  facility-administered medications for this visit.   Lisinopril; Oxycodone; and Penicillins  No family history on file.  Social History: reports that he quit smoking about 23 years ago. His smoking use included Cigarettes. He smoked 0.00 packs per day. He has never used smokeless tobacco. He reports that he does not drink alcohol or use illicit drugs.  REVIEW OF SYSTEMS - PERTINENT POSITIVES ONLY:  Is a pastor and runs a [employer]  Physical Exam:  Temperature 98 F (36.7 C), resp. rate 18, height 6' (1.829 m), weight 198 lb (89.812 kg).  Body mass index is 26.85 kg/(m^2).  Gen: WDWN WM NAD  Neurological: Alert and oriented to person, place, and time. Motor and sensory function is grossly intact  Head: Normocephalic and atraumatic.  Eyes: Conjunctivae are normal. Pupils are equal, round, and reactive to  light. No scleral icterus.  Neck: Normal range of motion. Neck supple. No tracheal deviation or thyromegaly present.  Pscyh: Normal mood and affect. Behavior is normal. Judgment and thought content normal.  LABORATORY RESULTS:  No results found for this or any previous visit (from the past 48 hour(s)).  RADIOLOGY RESULTS:  No results found.  Problem List:  Patient Active Problem List    Diagnosis  Date Noted   .  GERD (gastroesophageal reflux disease)  08/26/2013   .  Hiatus hernia syndrome  08/26/2013   Assessment & Plan:  Probable recurrent hiatal hernia with partially disrupted wrap producing dysphagia and esophageal dilatation. Plan redo laparoscopic Nissen fundoplication.  Matt B. Daphine Deutscher, MD, [facility]  [phone] beeper  [phone]

## 2013-12-04 NOTE — Interval H&P Note (Signed)
History and Physical Interval Note:  12/04/2013 7:28 AM  Eric Rollins  has presented today for surgery, with the diagnosis of redo nissen  The various methods of treatment have been discussed with the patient and family. After consideration of risks, benefits and other options for treatment, the patient has consented to  Procedure(s): REDO LAPAROSCOPIC NISSEN  (N/A) as a surgical intervention .  The patient's history has been reviewed, patient examined, no change in status, stable for surgery.  I have reviewed the patient's chart and labs.  Questions were answered to the patient's satisfaction.     [name] B

## 2013-12-04 NOTE — Progress Notes (Signed)
12/04/13 1130  PACU Vital Signs  Resp 13  BP ! 139/124 mmHg  Oxygen Therapy  SpO2 91 %  pt sleepy, apeneic, drops sats 79% on 3L, have to wake pt to breathe, md notified and cpap to be initiated per respiratory

## 2013-12-04 NOTE — Anesthesia Preprocedure Evaluation (Addendum)
Anesthesia Evaluation  Patient identified by MRN, date of birth, ID band Patient awake    Reviewed: Allergy & Precautions, H&P , NPO status , Patient's Chart, lab work & pertinent test results  Airway Mallampati: I TM Distance: >3 FB Neck ROM: Full    Dental no notable dental hx.    Pulmonary sleep apnea and Continuous Positive Airway Pressure Ventilation , former smoker,  breath sounds clear to auscultation  Pulmonary exam normal       Cardiovascular hypertension, Rhythm:Regular Rate:Normal     Neuro/Psych negative neurological ROS  negative psych ROS   GI/Hepatic Neg liver ROS, hiatal hernia, GERD-  Poorly Controlled,  Endo/Other  negative endocrine ROS  Renal/GU Renal InsufficiencyRenal disease  negative genitourinary   Musculoskeletal negative musculoskeletal ROS (+)   Abdominal   Peds negative pediatric ROS (+)  Hematology negative hematology ROS (+)   Anesthesia Other Findings   Reproductive/Obstetrics negative OB ROS                         Anesthesia Physical Anesthesia Plan  ASA: II  Anesthesia Plan: General   Post-op Pain Management:    Induction: Intravenous, Rapid sequence and Cricoid pressure planned  Airway Management Planned: Oral ETT  Additional Equipment:   Intra-op Plan:   Post-operative Plan: Extubation in OR  Informed Consent: I have reviewed the patients History and Physical, chart, labs and discussed the procedure including the risks, benefits and alternatives for the proposed anesthesia with the patient or authorized representative who has indicated his/her understanding and acceptance.   Dental advisory given  Plan Discussed with: CRNA  Anesthesia Plan Comments:         Anesthesia Quick Evaluation

## 2013-12-04 NOTE — Progress Notes (Signed)
Pt is using home BiPAP machine, full face mask and tubing. Sterile water was added for humidification. Home settings are unknown. [employer] was contacted to inspect home machine tomorrow morning when available. Pt was encouraged to contact RT throughout the night if needed. RT will continue to monitor as needed.

## 2013-12-04 NOTE — Brief Op Note (Signed)
12/04/2013  10:58 AM  PATIENT:  Eric Rollins  58 y.o. male  PRE-OPERATIVE DIAGNOSIS:  redo nissen  POST-OPERATIVE DIAGNOSIS:  redo nissen  PROCEDURE:  Procedure(s): REDO LAPAROSCOPIC NISSEN WITH UPPER ENDOSCOPY (N/A)  SURGEON:  Surgeon(s) and Role:    * Valarie MerinoMatthew B Lorry Furber, MD - Primary  PHYSICIAN ASSISTANT:   ASSISTANTS: Ovidio Kinavid Newman, MD, FACS   ANESTHESIA:   general  EBL:  Total I/O In: 2000 [I.V.:2000] Out: 240 [Urine:220; Blood:20]  BLOOD ADMINISTERED:none  DRAINS: none   LOCAL MEDICATIONS USED:  MARCAINE     SPECIMEN:  No Specimen  DISPOSITION OF SPECIMEN:  N/A  COUNTS:  YES  TOURNIQUET:  * No tourniquets in log *  DICTATION: .Other Dictation: Dictation Number [id number]  PLAN OF CARE: Admit to inpatient   PATIENT DISPOSITION:  PACU - hemodynamically stable.   Delay start of Pharmacological VTE agent (>24hrs) due to surgical blood loss or risk of bleeding: no

## 2013-12-04 NOTE — Progress Notes (Signed)
RT paged to [phone] to place PT back on PAP therapy as [facility]. RT placed PT back on FFM, large. Settings as stated in original Progress Note (Auto BiPAP, Max 20cm H20 pressure / Min 6 cm H20 pressure with 4 lpm 02 bleed in). Sp02 95%, RR 18, BS clear-diminished. RN aware PT on PAP and of settings.

## 2013-12-04 NOTE — Progress Notes (Signed)
Per MD order- RT placed PT on PAP therapy. RT placed PT on Auto BiPAP, (PT unable to provide home settings, MD order did not include specific settings) max 20cm H20 pressure / min 6cm H20 pressure with 4 lpm 02 bleed in (RN aware of settings). PT confirms he is receiving enough air and is able to breathe out fully. Sp02 98%, RR 19, HR 80, BS clear- diminished. RN encouraged to contact RT if needed. PT is utilizing a full face mask large.

## 2013-12-04 NOTE — Progress Notes (Signed)
12/04/2013  10:25 AM  PATIENT:  Eric Rollins Chiang, 58 y.o., male, MRN: [id number]  PREOP DIAGNOSIS:  History of prior hiatal hernia repair with dysphagia, probable slipped fundoplicatin   POSTOP DIAGNOSIS:   History of prior hiatal hernia repair with dysphagia, slipped prior fundoplication  PROCEDURE:  Esophagogastroscopy  SURGEON:   Ovidio Kinavid Chakara Bognar, M.D.  ANESTHESIA:   GET  INDICATIONS FOR PROCEDURE:  Eric Rollins Olguin is a 58 y.o. (DOB: 08/24/1955)  white  male whose primary care physician is Hope Pigeonverly, Rebecca B.  He is undergoing a laparoscopic exploration by Dr. Sheron NightingaleM. Martin for a slipped fundoplicaiton.   He had a prior hiatal hernia repair around 1997 in [place]. He did well until about 3 years ago when he has noticed increasing dysphagia   Dr. Daphine DeutscherMartin has completed the dissection of the slipped Nissen, reduced the stomach back into the abdominal cavity, and identified both cruz and hiatal hernia.  I am doing an endoscopy to identify the esophagogastric junction, make sure there is no injury to the esophagus, and check the stomach.  PROCEDURE:  The patient [facility] under general [facility]  Dr. Daphine DeutscherMartin is manning the laparoscope to look at the anatomy for the "outside".   Esophagus:   Somewhat dilated.  No evidence of injury   GE junction at:  42 cm   Stomach: Normal mucosa, no evidence of injury.   Duodenum:   Not cannulated.  PLAN:  Dr. Daphine DeutscherMartin is going to close the hiatus and redo the fundoplication.  He will dictate that portion of the operation.  Ovidio Kinavid Sonyia Muro, MD, [facility] Pager: [phone] Office phone:  [phone]

## 2013-12-04 NOTE — Anesthesia Postprocedure Evaluation (Signed)
  Anesthesia Post-op Note  Patient: Doctor, hospitalBilly Rollins  Procedure(s) Performed: Procedure(s) (LRB): REDO LAPAROSCOPIC NISSEN WITH UPPER ENDOSCOPY (N/A)  Patient Location: PACU  Anesthesia Type: General  Level of Consciousness: awake and alert   Airway and Oxygen Therapy: Patient Spontanous Breathing  Post-op Pain: mild  Post-op Assessment: Post-op Vital signs reviewed, Patient's Cardiovascular Status Stable, Respiratory Function Stable, Patent Airway and No signs of Nausea or vomiting  Last Vitals:  Filed Vitals:   12/04/13 1245  BP: 143/98  Pulse: 60  Temp: 36.5 C  Resp: 12    Post-op Vital Signs: stable   Complications: No apparent anesthesia complications

## 2013-12-04 NOTE — Transfer of Care (Signed)
Immediate Anesthesia Transfer of Care Note  Patient: Eric Rollins  Procedure(s) Performed: Procedure(s): REDO LAPAROSCOPIC NISSEN WITH UPPER ENDOSCOPY (N/A)  Patient Location: PACU  Anesthesia Type:General  Level of Consciousness: awake, oriented and patient cooperative  Airway & Oxygen Therapy: Patient Spontanous Breathing and Patient connected to face mask oxygen  Post-op Assessment: Report given to PACU RN, Post -op Vital signs reviewed and stable and Patient moving all extremities  Post vital signs: Reviewed and stable  Complications: No apparent anesthesia complications

## 2013-12-05 ENCOUNTER — Inpatient Hospital Stay (HOSPITAL_COMMUNITY): Payer: BC Managed Care – PPO

## 2013-12-05 DIAGNOSIS — F410 Panic disorder [episodic paroxysmal anxiety] without agoraphobia: Secondary | ICD-10-CM | POA: Diagnosis present

## 2013-12-05 DIAGNOSIS — G4730 Sleep apnea, unspecified: Secondary | ICD-10-CM | POA: Diagnosis present

## 2013-12-05 DIAGNOSIS — N189 Chronic kidney disease, unspecified: Secondary | ICD-10-CM | POA: Diagnosis present

## 2013-12-05 DIAGNOSIS — K219 Gastro-esophageal reflux disease without esophagitis: Secondary | ICD-10-CM | POA: Diagnosis present

## 2013-12-05 DIAGNOSIS — I10 Essential (primary) hypertension: Secondary | ICD-10-CM | POA: Diagnosis present

## 2013-12-05 LAB — BASIC METABOLIC PANEL
ANION GAP: 9 (ref 5–15)
BUN: 15 mg/dL (ref 6–23)
CALCIUM: 8.3 mg/dL — AB (ref 8.4–10.5)
CO2: 26 mEq/L (ref 19–32)
CREATININE: 1.37 mg/dL — AB (ref 0.50–1.35)
Chloride: 101 mEq/L (ref 96–112)
GFR calc Af Amer: 65 mL/min — ABNORMAL LOW (ref >90)
GFR, EST NON AFRICAN AMERICAN: 56 mL/min — AB (ref >90)
Glucose, Bld: 160 mg/dL — ABNORMAL HIGH (ref 70–99)
Potassium: 4.3 mEq/L (ref 3.7–5.3)
Sodium: 136 mEq/L — ABNORMAL LOW (ref 137–147)

## 2013-12-05 LAB — CBC
HCT: 36.8 % — ABNORMAL LOW (ref 39.0–52.0)
Hemoglobin: 12.4 g/dL — ABNORMAL LOW (ref 13.0–17.0)
MCH: 31 pg (ref 26.0–34.0)
MCHC: 33.7 g/dL (ref 30.0–36.0)
MCV: 92 fL (ref 78.0–100.0)
Platelets: 154 10*3/uL (ref 150–400)
RBC: 4 MIL/uL — ABNORMAL LOW (ref 4.22–5.81)
RDW: 12.5 % (ref 11.5–15.5)
WBC: 10.3 10*3/uL (ref 4.0–10.5)

## 2013-12-05 MED ORDER — IOHEXOL 300 MG/ML  SOLN
50.0000 mL | Freq: Once | INTRAMUSCULAR | Status: AC | PRN
  Administered 2013-12-05: 50 mL via ORAL

## 2013-12-05 MED ORDER — IOHEXOL 300 MG/ML  SOLN: 50.0000 mL | Freq: Once | INTRAMUSCULAR | Status: AC | PRN

## 2013-12-05 NOTE — Op Note (Signed)
NAMEDOZIER, BERKOVICH NO.:  [id number]  MEDICAL RECORD NO.:  [id number]  LOCATION:  [facility]                         FACILITY:  [facility]  PHYSICIAN:  Thornton Park. Daphine Deutscher, MD  DATE OF BIRTH:  July 23, 1955  DATE OF PROCEDURE:  12/04/2013 DATE OF DISCHARGE:                              OPERATIVE REPORT   PREOPERATIVE DIAGNOSES:  The patient with prior lap Nissen from 1997 with apparent herniation of the wrap above the diaphragm and dysphagia.  PROCEDURE:  Laparoscopic takedown of prior Nissen fundoplication, upper endoscopy by Dr. Ezzard Standing, closure of hiatal hernia, redo Nissen fundoplication over a 56 bougie.  SURGEON:  Thornton Park. Daphine Deutscher, MD  ASSISTANT:  Ovidio Kin.  ANESTHESIA:  General endotracheal.  DESCRIPTION OF PROCEDURE:  This 58 year old white male was taken to OR 1 at [facility] and given general anesthesia.  The abdomen was prepped with PCMX and was draped sterilely.  After time-out was performed, access to the abdomen was achieved through the left upper quadrant using a 5-mm Optiview.  The abdomen was relatively free of adhesions anteriorly.  I placed 3 other abdominal working ports in the upper midline, the Nathanson retractor was inserted to a 5 mm.  The one onto the right inferiorly was subsequently upgraded to a 12 mm for the Endo Stitch.  Dissection began sharply with scissors taking the wrap down from the liver.  Once this was performed, I then began dissecting cephalad and with the scissor dissection found the wrap to be herniated above the diaphragm.  This was relatively straight forward dissection and this was done with scissors and I was able to completely reduce the wrap which was all the way above the diaphragm on the left side.  I then took down the wrap, eventually taking down completely and unwrapping the stomach from itself.  In the meantime, I had gotten the posterior portion exposing both crura posteriorly and following this complete  mobilization and dissection, Dr. Ezzard Standing endoscoped the patient, so we knew where the Z-line was and saw no evidence of any injury to the esophagus or the stomach.  Following this, we then closed the hiatus posteriorly with 3 sutures of Surgidac passed with the Endo Stitch and secured with tie knots.  Once this was done, we passed a 56 lighted bougie into the stomach and with that in place, I then recreated the wrap using the stomach and portion of the prior wrap and this came together in the midline nicely over the 56 bougie.  Three sutures were taken taking purchases of the esophagus and these were secured with tie knots.  When completed, the bougie was withdrawn and the wrap looked good and healthy.  The hernia repair looked good as well.  I injected all the ports with Exparel and deflated the abdomen.  The wounds were then closed with 4-0 Vicryl and Dermabond. The patient tolerated the procedure well, was taken to recovery room in satisfactory condition.     Thornton Park Daphine Deutscher, MD     MBM/MEDQ  D:  12/04/2013  T:  12/04/2013  Job:  [id number]

## 2013-12-05 NOTE — Discharge Instructions (Signed)
Full liquid diet for one week after discharge then pureed diet for 3 weeks.  Dysphagia Level 1 Diet, Pureed The dysphasia level 1 diet includes foods that are completely pureed and smooth. The foods have a pudding-like texture, such as the texture of pureed pancakes, mashed potatoes, and yogurt. The diet does not include foods with lumps or coarse textures. Liquids should be smooth and may either be thin, nectar-thick, honey-like, or spoon-thick. This diet is helpful for people with moderate to severe swallowing problems. It reduces the risk of food getting caught in the windpipe, trachea, or lungs. You may need help or supervision during meals while following this diet. WHAT DO I NEED TO KNOW ABOUT THIS DIET? Foods  You may eat foods that are soft and have a pudding-like texture. If a food does not have this texture, you may be able to eat the food after:  Pureeing it. This can be done with a blender or whisk.  Moistening it with liquid. For example, you may have bread if you soak it in milk or syrup.  Avoid foods that are hard, dry, sticky, chunky, lumpy, or stringy. Also avoid foods with nuts, seeds, raisins, skins, and pulp.  Do not eat foods that you have to chew. If you have to chew the food, then you cannot eat it.  Eat a variety of foods to get all the nutrients you need. Liquids  You may drink liquids that are smooth. Your health care provider will tell you if you should drink thin or thickened liquids.  To thicken a liquid, use a food and beverage thickener or a thickening food. Thickened liquids are usually a "pudding-like" consistency.  Thin liquids include fruit juices, milk, coffee, tea, yogurts, shakes, and similar foods that melt to thin liquid at room temperature.  Avoid liquids with seeds, pulp, or chunks. See your dietitian or health care provider regularly for help with your dietary changes. WHAT FOODS CAN I EAT? Grains Store-bought soft breads, pancakes, and [place]  toast that have a smooth, moist texture and do not have nuts or seeds (you will need to moisten the food with liquid). Cooked cereals that have a pudding-like consistency, such as cream of wheat or farina (no oatmeal). Pureed, well-cooked pasta, rice, and plain bread stuffing. Vegetables Pureed vegetables. Soft avocado. Smooth tomato paste or sauce. Strained or pureed soups (these may need to be thickened as directed). Mashed or pureed potatoes without skin (can be seasoned with butter, smooth gravy, margarine, or sour cream). Fruits Pureed fruits such as melons and apples without seeds or pulp. Mashed bananas. Smooth tomato paste or sauce. Fruit juices without pulp or seeds. Strained or pureed soups. Meat and Other Protein Sources Pureed meat. Smooth pate or liverwurst. Smooth souffles. Pureed beans (such as lentils). Pureed eggs. Dairy Yogurt. Smooth cheese sauces. Milk (may need to be thickened). Nutritional dairy drinks or shakes. Ask your health care provider whether you can have ice cream. Condiments Finely ground salt, pepper, and other ground spices. Sweets/Desserts Smooth puddings and custards. Pureed desserts. Souffles. Whipped topping. Ask your health care provider whether you can have frozen desserts. Fats and Oils Butter. Margarine. Smooth and strained gravy. Sour cream. Mayonnaise. Cream cheese. Whipped topping. Smooth sauces (such as white sauce, cheese sauce, or hollandaise sauce). The items listed above may not be a complete list of recommended foods or beverages. Contact your dietitian for more options. WHAT FOODS ARE NOT RECOMMENDED? Grains Oatmeal. Dry cereals. Hard breads. Vegetables Whole vegetables. Stringy vegetables (such  as celery). Thin tomato sauce. Fruits Whole fresh, frozen, canned, or dried fruits that have not been pureed. Stringy fruits (such as pineapple). Meat and Other Protein Sources Whole or ground meat, fish, or poultry. Dried or cooked lentils or  legumes that have been cooked but not mashed or pureed. Non-pureed eggs. Nuts and seeds. Peanut butter. Dairy Non-pureed cheese. Dairy products with lumps or chunks. Ask your health care provider whether you can have ice cream. Condiments Coarse or seeded herbs and spices. Sweets/Desserts [place] preserves. Jams with seeds. Solid desserts. Sticky, chewy sweets (such as licorice and caramel). Ask your health care provider whether you can have frozen desserts. Fats and Oils Sauces of fats with lumps or chunks. The items listed above may not be a complete list of foods and beverages to avoid. Contact your dietitian for more information. Document Released: 04/02/2005 Document Revised: 08/17/2013 Document Reviewed: 03/16/2013 [facility] Patient Information 2015 [place], [place]. This information is not intended to replace advice given to you by your health care provider. Make sure you discuss any questions you have with your health care provider.

## 2013-12-05 NOTE — Progress Notes (Signed)
RT filled humidity chamber up on Pt's home CPAP.  Pt stated that he would self administer when ready for bed.  Pt to notify RT if any complications should arise.  RT to monitor and assess as needed.

## 2013-12-05 NOTE — Progress Notes (Signed)
Patient ID: Eric Rollins, male   DOB: [birth year], 58 y.o.   MRN: [id number] [facility] Progress Note:   1 Day Post-Op  Subjective: Mental status is clear.  Slept well with CPAP and with Ativan for anxiety Objective: Vital signs in last 24 hours: Temp:  [97.4 F (36.3 C)-98.9 F (37.2 C)] 98.9 F (37.2 C) (08/22 0631) Pulse Rate:  [60-99] 76 (08/22 0631) Resp:  [12-23] 15 (08/22 0631) BP: (113-155)/(68-124) 129/71 mmHg (08/22 0631) SpO2:  [87 %-100 %] 98 % (08/22 0631)  Intake/Output from previous day: 08/21 0701 - 08/22 0700 In: 4041 [I.V.:4041] Out: 1990 [Urine:1970; Blood:20] Intake/Output this shift:    Physical Exam: Work of breathing is not labored.  Abdomen is sore.   Lab Results:  Results for orders placed during the hospital encounter of 12/04/13 (from the past 48 hour(s))  CBC     Status: None   Collection Time    12/04/13  1:39 PM      Result Value Ref Range   WBC 9.8  4.0 - 10.5 K/uL   RBC 4.45  4.22 - 5.81 MIL/uL   Hemoglobin 14.0  13.0 - 17.0 g/dL   HCT 40.0  39.0 - 52.0 %   MCV 89.9  78.0 - 100.0 fL   MCH 31.5  26.0 - 34.0 pg   MCHC 35.0  30.0 - 36.0 g/dL   RDW 12.2  11.5 - 15.5 %   Platelets 153  150 - 400 K/uL  CREATININE, SERUM     Status: Abnormal   Collection Time    12/04/13  1:39 PM      Result Value Ref Range   Creatinine, Ser 1.34  0.50 - 1.35 mg/dL   GFR calc non Af Amer 58 (*) >90 mL/min   GFR calc Af Amer 67 (*) >90 mL/min   Comment: (NOTE)     The eGFR has been calculated using the CKD EPI equation.     This calculation has not been validated in all clinical situations.     eGFR's persistently <90 mL/min signify possible Chronic Kidney     Disease.  CBC     Status: Abnormal   Collection Time    12/05/13  5:27 AM      Result Value Ref Range   WBC 10.3  4.0 - 10.5 K/uL   RBC 4.00 (*) 4.22 - 5.81 MIL/uL   Hemoglobin 12.4 (*) 13.0 - 17.0 g/dL   HCT 36.8 (*) 39.0 - 52.0 %   MCV 92.0  78.0 - 100.0 fL   MCH 31.0  26.0 - 34.0  pg   MCHC 33.7  30.0 - 36.0 g/dL   RDW 12.5  11.5 - 15.5 %   Platelets 154  150 - 400 K/uL  BASIC METABOLIC PANEL     Status: Abnormal   Collection Time    12/05/13  5:27 AM      Result Value Ref Range   Sodium 136 (*) 137 - 147 mEq/L   Potassium 4.3  3.7 - 5.3 mEq/L   Chloride 101  96 - 112 mEq/L   CO2 26  19 - 32 mEq/L   Glucose, Bld 160 (*) 70 - 99 mg/dL   BUN 15  6 - 23 mg/dL   Creatinine, Ser 1.37 (*) 0.50 - 1.35 mg/dL   Calcium 8.3 (*) 8.4 - 10.5 mg/dL   GFR calc non Af Amer 56 (*) >90 mL/min   GFR calc Af Amer 65 (*) >90 mL/min  Comment: (NOTE)     The eGFR has been calculated using the CKD EPI equation.     This calculation has not been validated in all clinical situations.     eGFR's persistently <90 mL/min signify possible Chronic Kidney     Disease.   Anion gap 9  5 - 15    Radiology/Results: No results found.  Anti-infectives: Anti-infectives   Start     Dose/Rate Route Frequency Ordered Stop   12/04/13 0516  cefOXitin (MEFOXIN) 2 g in dextrose 5 % 50 mL IVPB     2 g 100 mL/hr over 30 Minutes Intravenous On call to O.R. 12/04/13 0516 12/04/13 0742      Assessment/Plan: Problem List: Patient Active Problem List   Diagnosis Date Noted  . Redo Nissen and hiatus hernia closure August 15 12/04/2013    Awaits UGI-if OK will start noncarbonated clears and advance to full liquids tomorrow.   1 Day Post-Op    LOS: 1 day   Matt B. Hassell Done, MD, [facility] [phone] beeper [phone]  12/05/2013 7:17 AM

## 2013-12-06 LAB — BASIC METABOLIC PANEL
Anion gap: 9 (ref 5–15)
BUN: 12 mg/dL (ref 6–23)
CO2: 27 mEq/L (ref 19–32)
Calcium: 8.3 mg/dL — ABNORMAL LOW (ref 8.4–10.5)
Chloride: 105 mEq/L (ref 96–112)
Creatinine, Ser: 1.17 mg/dL (ref 0.50–1.35)
GFR, EST AFRICAN AMERICAN: 79 mL/min — AB (ref >90)
GFR, EST NON AFRICAN AMERICAN: 68 mL/min — AB (ref >90)
Glucose, Bld: 105 mg/dL — ABNORMAL HIGH (ref 70–99)
POTASSIUM: 4.4 meq/L (ref 3.7–5.3)
Sodium: 141 mEq/L (ref 137–147)

## 2013-12-06 LAB — CBC
HEMATOCRIT: 35.4 % — AB (ref 39.0–52.0)
Hemoglobin: 11.9 g/dL — ABNORMAL LOW (ref 13.0–17.0)
MCH: 31.3 pg (ref 26.0–34.0)
MCHC: 33.6 g/dL (ref 30.0–36.0)
MCV: 93.2 fL (ref 78.0–100.0)
PLATELETS: 137 10*3/uL — AB (ref 150–400)
RBC: 3.8 MIL/uL — ABNORMAL LOW (ref 4.22–5.81)
RDW: 12.6 % (ref 11.5–15.5)
WBC: 5.9 10*3/uL (ref 4.0–10.5)

## 2013-12-06 MED ORDER — OXYCODONE-ACETAMINOPHEN 5-325 MG PO TABS: 1.0000 | ORAL_TABLET | ORAL | Status: DC | PRN

## 2013-12-06 MED ORDER — OXYCODONE-ACETAMINOPHEN 5-325 MG PO TABS
1.0000 | ORAL_TABLET | ORAL | Status: DC | PRN
  Administered 2013-12-06 (×2): 1 via ORAL
  Filled 2013-12-06 (×2): qty 1

## 2013-12-06 NOTE — Progress Notes (Signed)
Patient ID: Eric Rollins, male   DOB: [birth year], 57 y.o.   MRN: [id number]  General Surgery - [facility] - Progress Note  POD# 2  Subjective: Patient doing well.  Anxious to go home.  Wife at bedside.  Tolerating clear liquid diet.  Foley still in place.  Still on PCA analgesics.  Objective: Vital signs in last 24 hours: Temp:  [97.5 F (36.4 C)-98.6 F (37 C)] 97.5 F (36.4 C) (08/23 0545) Pulse Rate:  [66-83] 66 (08/23 0545) Resp:  [12-22] 14 (08/23 0545) BP: (106-136)/(60-78) 106/60 mmHg (08/23 0545) SpO2:  [94 %-100 %] 100 % (08/23 0545) Last BM Date: 12/03/13  Intake/Output from previous day: 08/22 0701 - 08/23 0700 In: 2418.3 [I.V.:2418.3] Out: 3050 [Urine:3050]  Exam: HEENT - clear, not icteric Neck - soft Chest - clear bilaterally Cor - RRR, no murmur Abd - soft, wounds clear and dry and intact with Dermabond; BS present Ext - no significant edema Neuro - grossly intact, no focal deficits  Lab Results:   Recent Labs  12/05/13 0527 12/06/13 0614  WBC 10.3 5.9  HGB 12.4* 11.9*  HCT 36.8* 35.4*  PLT 154 137*     Recent Labs  12/05/13 0527 12/06/13 0614  NA 136* 141  K 4.3 4.4  CL 101 105  CO2 26 27  GLUCOSE 160* 105*  BUN 15 12  CREATININE 1.37* 1.17  CALCIUM 8.3* 8.3*    Studies/Results: Dg Ugi W/water Sol Cm  12/05/2013   CLINICAL DATA:  A limited upper GI series was performed to evaluate the revision of the gastric wrap and reduction of the hiatal hernia  EXAM: WATER SOLUBLE UPPER GI SERIES  TECHNIQUE: Single-column upper GI series was performed using water soluble contrast.  CONTRAST:  50 cc of Omnipaque 300 orally semi: The patient also ingested approximately 50 cc of water  COMPARISON:  None.  FLUOROSCOPY TIME:  1 min, 26 seconds  FINDINGS: The scout film revealed a normal gas pattern. The patient ingested the water-soluble contrast without difficulty. It passed freely to the distal esophagus. There it remained for  7-10seconds and then began passing through the wrap into the stomach. There was no evidence of leakage. No residual hiatal hernia was demonstrated. The patient noted some discomfort as if the contrast would not pass or that reflux was occurring but under fluoroscopy there was no evidence of any reflux. This sensation gradually improved as the contrast did pass into the stomach and was followed with ingestion of water. The observed portions of the stomach were unremarkable.  IMPRESSION: 1. There is no evidence of a leak at the level of the GE junction. No hiatal hernia is demonstrated. 2. There is mild delay in passage of the contrast into the stomach which is likely physiologic and related to the immediate post-operative state. 3. These results were called by me by telephone at the time of interpretation on 12/05/2013 at 11:54 am to Dr. Luretha Murphy , who verbally acknowledged these results.   Electronically Signed   By: David  [place]   On: 12/05/2013 11:54    Assessment / Plan: 1.  Status post redo fundoplication  Advance to full liquid diet  Discontinue Foley catheter  Ambulate in halls  Begin po pain Rx, discontinue PCA  Possibly home later today if doing well  Velora Heckler, MD, [facility] Office: [phone]  12/06/2013

## 2013-12-07 ENCOUNTER — Encounter (HOSPITAL_COMMUNITY): Payer: Self-pay | Admitting: Surgery

## 2013-12-08 ENCOUNTER — Telehealth (INDEPENDENT_AMBULATORY_CARE_PROVIDER_SITE_OTHER): Payer: Self-pay

## 2013-12-08 DIAGNOSIS — Z9889 Other specified postprocedural states: Principal | ICD-10-CM

## 2013-12-08 DIAGNOSIS — R112 Nausea with vomiting, unspecified: Secondary | ICD-10-CM

## 2013-12-08 MED ORDER — ONDANSETRON HCL 4 MG PO TABS: 4.0000 mg | ORAL_TABLET | Freq: Four times a day (QID) | ORAL | Status: DC

## 2013-12-08 NOTE — Telephone Encounter (Signed)
Pt called in 4 days s/p redo of lap nissen. He complains of cold sweats and nausea. He almost vomited to day but was able to stop it from coming up. He is able to get liquids down. Pt denies fever. Spoke to Dr Andrey Campanile our urgent office doctor and he says the belching type feeling like he is about to vomit is normal post operatively with this surgery. He okayed for [place] to send protocol zofran to pts pharmacy to see if this helps with his symptoms. He also suggests patient take OTC maalox to help settle his stomach. When I called pt back he also states he has not had a BM since surgery but is passing gas. Advised him to start miralax and drink as much water as he can to help get his bowels moving. Pt will call if symptoms persist or worsen.

## 2013-12-16 NOTE — Discharge Summary (Addendum)
Physician Discharge Summary  Patient ID: Eric Rollins MRN: [id number] DOB/AGE: December 21, 1955 58 y.o.  Admit date: 12/04/2013 Discharge date: 12/06/2013 Admission Diagnoses:  Dysphagia from prior Nissen in the 90s done in [place], [place]  Discharge Diagnoses:  same  Principal Problem:   Recurrent Hiatal hernia s/p redo Nissen and hiatal hernia closure 12/04/2013 Active Problems:   GERD (gastroesophageal reflux disease)   Chronic kidney disease   Sleep apnea   Hypertension   Panic attacks   Surgery:  Redo Nissen and repair of hiatus hernia  Discharged Condition: improved  Hospital Course:   Had surgery on Friday;  UGI was done on Saturday looked good.  Ready for discharge on Sunday and this was done by Dr. Gerrit Friends in my absence  Consults: none  Significant Diagnostic Studies: UGI looked good    Discharge Exam: Blood pressure 148/95, pulse 66, temperature 97.4 F (36.3 C), temperature source Oral, resp. rate 16, height 6' (1.829 m), weight 202 lb 8 oz (91.853 kg), SpO2 95.00%. Performed by Dr. Gerrit Friends  Disposition: 01-Home or Self Care  Discharge Instructions   Diet - low sodium heart healthy    Complete by:  As directed      Increase activity slowly    Complete by:  As directed             Medication List         BIOTENE ORALBALANCE DRY MOUTH Liqd  Use as directed in the mouth or throat See admin instructions. He uses one spray at bedtime.     doxazosin 1 MG tablet  Commonly known as:  CARDURA  Take 1 mg by mouth at bedtime.     LORazepam 0.5 MG tablet  Commonly known as:  ATIVAN  Take 0.5 mg by mouth every 6 (six) hours as needed for anxiety.     metoprolol succinate 100 MG 24 hr tablet  Commonly known as:  TOPROL-XL  Take 100 mg by mouth every morning.     NEXIUM 40 MG capsule  Generic drug:  esomeprazole  Take 40 mg by mouth every morning.     OVER THE COUNTER MEDICATION  Take 2,500 mcg by mouth every morning. Vitamin B-12     oxyCODONE-acetaminophen 5-325 MG per tablet  Commonly known as:  ROXICET  Take 1-2 tablets by mouth every 4 (four) hours as needed for moderate pain.     rOPINIRole 0.25 MG tablet  Commonly known as:  REQUIP  Take 0.25 mg by mouth 3 (three) times daily. TAKES 2 AT BEDTIME     triamterene-hydrochlorothiazide 37.5-25 MG per tablet  Commonly known as:  MAXZIDE-25  Take 1 tablet by mouth every morning.           Follow-up Information   Follow up with Luretha Murphy B, MD In 2 weeks.   Specialty:  General Surgery   Contact information:   [address] [place] [address] [phone]       Signed: Valarie Merino 12/16/2013, 6:40 AM

## 2013-12-17 ENCOUNTER — Encounter (INDEPENDENT_AMBULATORY_CARE_PROVIDER_SITE_OTHER): Payer: BC Managed Care – HMO | Admitting: Surgery

## 2014-02-04 ENCOUNTER — Other Ambulatory Visit (INDEPENDENT_AMBULATORY_CARE_PROVIDER_SITE_OTHER): Payer: Self-pay | Admitting: Surgery

## 2014-02-04 DIAGNOSIS — R109 Unspecified abdominal pain: Secondary | ICD-10-CM

## 2014-02-09 ENCOUNTER — Inpatient Hospital Stay: Admission: RE | Admit: 2014-02-09 | Payer: BC Managed Care – PPO | Source: Ambulatory Visit

## 2015-08-01 ENCOUNTER — Other Ambulatory Visit: Payer: Self-pay | Admitting: Surgery

## 2015-08-01 DIAGNOSIS — K449 Diaphragmatic hernia without obstruction or gangrene: Secondary | ICD-10-CM

## 2015-08-08 ENCOUNTER — Ambulatory Visit
Admission: RE | Admit: 2015-08-08 | Discharge: 2015-08-08 | Disposition: A | Discharge status: Home / Self Care | Payer: BLUE CROSS/BLUE SHIELD | Source: Ambulatory Visit | Attending: Surgery | Admitting: Surgery

## 2015-08-08 DIAGNOSIS — K449 Diaphragmatic hernia without obstruction or gangrene: Secondary | ICD-10-CM

## 2015-08-08 MED ORDER — IOPAMIDOL (ISOVUE-300) INJECTION 61%
100.0000 mL | Freq: Once | INTRAVENOUS | Status: AC | PRN
  Administered 2015-08-08: 100 mL via INTRAVENOUS

## 2015-08-17 ENCOUNTER — Other Ambulatory Visit (HOSPITAL_COMMUNITY): Payer: Self-pay | Admitting: Surgery

## 2015-08-17 DIAGNOSIS — R1310 Dysphagia, unspecified: Secondary | ICD-10-CM

## 2015-08-23 ENCOUNTER — Ambulatory Visit (HOSPITAL_COMMUNITY)
Admission: RE | Admit: 2015-08-23 | Discharge: 2015-08-23 | Disposition: A | Discharge status: Home / Self Care | Payer: BLUE CROSS/BLUE SHIELD | Source: Ambulatory Visit | Attending: Surgery | Admitting: Surgery

## 2015-08-23 DIAGNOSIS — R1310 Dysphagia, unspecified: Secondary | ICD-10-CM

## 2015-12-29 ENCOUNTER — Ambulatory Visit: Payer: Self-pay | Admitting: Surgery

## 2016-01-04 ENCOUNTER — Encounter (HOSPITAL_COMMUNITY): Payer: Self-pay

## 2016-01-06 ENCOUNTER — Encounter (HOSPITAL_COMMUNITY)
Admission: RE | Admit: 2016-01-06 | Discharge: 2016-01-06 | Disposition: A | Discharge status: Home / Self Care | Payer: BLUE CROSS/BLUE SHIELD | Source: Ambulatory Visit | Attending: Surgery | Admitting: Surgery

## 2016-01-06 ENCOUNTER — Encounter (INDEPENDENT_AMBULATORY_CARE_PROVIDER_SITE_OTHER): Payer: Self-pay

## 2016-01-06 ENCOUNTER — Encounter (HOSPITAL_COMMUNITY): Payer: Self-pay

## 2016-01-06 DIAGNOSIS — K219 Gastro-esophageal reflux disease without esophagitis: Secondary | ICD-10-CM | POA: Insufficient documentation

## 2016-01-06 DIAGNOSIS — Z01812 Encounter for preprocedural laboratory examination: Secondary | ICD-10-CM | POA: Insufficient documentation

## 2016-01-06 HISTORY — DX: Personal history of other medical treatment: Z92.89

## 2016-01-06 LAB — CBC
HCT: 45.3 % (ref 39.0–52.0)
Hemoglobin: 15.9 g/dL (ref 13.0–17.0)
MCH: 31.8 pg (ref 26.0–34.0)
MCHC: 35.1 g/dL (ref 30.0–36.0)
MCV: 90.6 fL (ref 78.0–100.0)
PLATELETS: 208 10*3/uL (ref 150–400)
RBC: 5 MIL/uL (ref 4.22–5.81)
RDW: 12.4 % (ref 11.5–15.5)
WBC: 5.1 10*3/uL (ref 4.0–10.5)

## 2016-01-06 LAB — BASIC METABOLIC PANEL
Anion gap: 8 (ref 5–15)
BUN: 16 mg/dL (ref 6–20)
CO2: 30 mmol/L (ref 22–32)
CREATININE: 1.33 mg/dL — AB (ref 0.61–1.24)
Calcium: 9.5 mg/dL (ref 8.9–10.3)
Chloride: 100 mmol/L — ABNORMAL LOW (ref 101–111)
GFR calc Af Amer: >60 mL/min (ref >60)
GFR, EST NON AFRICAN AMERICAN: 57 mL/min — AB (ref >60)
Glucose, Bld: 90 mg/dL (ref 65–99)
Potassium: 3.8 mmol/L (ref 3.5–5.1)
SODIUM: 138 mmol/L (ref 135–145)

## 2016-01-06 NOTE — Patient Instructions (Addendum)
Eric Rollins  01/06/2016 9-  Your procedure is scheduled on: 01-13-16   [facility]  [facility]  [facility]vators to 3rd floor to  Short Stay Center at   0530 AM.  Call this number if you have problems the morning of surgery [phone]   Remember: ONLY 1 PERSON MAY GO WITH YOU TO SHORT STAY TO GET  READY MORNING OF YOUR SURGERY.  Do not eat food or drink liquids :After Midnight.     Take these medicines the morning of surgery with A SIP OF WATER: Metoprolol. Nexium. Alprazolam-if need.l DO NOT TAKE ANY DIABETIC MEDICATIONS DAY OF YOUR SURGERY                               You may not have any metal on your body including hair pins and              piercings  Do not wear jewelry, make-up, lotions, powders or perfumes, deodorant             Do not wear nail polish.  Do not shave  48 hours prior to surgery.              Men may shave face and neck.   Do not bring valuables to the hospital. [facility] IS NOT             RESPONSIBLE   FOR VALUABLES.  Contacts, dentures or bridgework may not be worn into surgery.  Leave suitcase in the car. After surgery it may be brought to your room.     Patients discharged the day of surgery will not be allowed to drive home.  Name and phone number of your driver: Tamala FothergillJane-spouse [phone] cell  Special Instructions: N/A              Please read over the following fact sheets you were given: _____________________________________________________________________             [facility] - Preparing for Surgery Before surgery, you can play an important role.  Because skin is not sterile, your skin needs to be as free of germs as possible.  You can reduce the number of germs on your skin by washing with CHG (chlorahexidine gluconate) soap before surgery.  CHG is an antiseptic cleaner which kills germs and bonds with the skin to continue killing germs even after washing. Please DO NOT use if you have an allergy to  CHG or antibacterial soaps.  If your skin becomes reddened/irritated stop using the CHG and inform your nurse when you arrive at Short Stay. Do not shave (including legs and underarms) for at least 48 hours prior to the first CHG shower.  You may shave your face/neck. Please follow these instructions carefully:  1.  Shower with CHG Soap the night before surgery and the  morning of Surgery.  2.  If you choose to wash your hair, wash your hair first as usual with your  normal  shampoo.  3.  After you shampoo, rinse your hair and body thoroughly to remove the  shampoo.                           4.  Use CHG as you would any other liquid soap.  You can apply chg directly  to the skin and wash                       Gently with a scrungie or clean washcloth.  5.  Apply the CHG Soap to your body ONLY FROM THE NECK DOWN.   Do not use on face/ open                           Wound or open sores. Avoid contact with eyes, ears mouth and genitals (private parts).                       Wash face,  Genitals (private parts) with your normal soap.             6.  Wash thoroughly, paying special attention to the area where your surgery  will be performed.  7.  Thoroughly rinse your body with warm water from the neck down.  8.  DO NOT shower/wash with your normal soap after using and rinsing off  the CHG Soap.                9.  Pat yourself dry with a clean towel.            10.  Wear clean pajamas.            11.  Place clean sheets on your bed the night of your first shower and do not  sleep with pets. Day of Surgery : Do not apply any lotions/deodorants the morning of surgery.  Please wear clean clothes to the hospital/surgery center.  FAILURE TO FOLLOW THESE INSTRUCTIONS MAY RESULT IN THE CANCELLATION OF YOUR SURGERY PATIENT SIGNATURE_________________________________  NURSE SIGNATURE__________________________________  ________________________________________________________________________

## 2016-01-06 NOTE — Pre-Procedure Instructions (Signed)
EKG requested form [facility] 3'17 awaiting faxed report.

## 2016-01-09 NOTE — Pre-Procedure Instructions (Signed)
01-06-16 1605 PM EKG of 06-25-15 faxed from [facility] and placed  with chart.

## 2016-01-12 NOTE — H&P (Signed)
Eric Rollins Location: [facility] Patient #: [id number] DOB: 01/06/1956 Married / Language: English / Race: White Male   History of Present Illness  The patient is a 60 year old male who presents with an umbilical hernia. Small epigastric hernia that he says gets about the size of a small plum after eating. This is just above the umbilicus and is an old port site. I reviewed his swallow which was done with speech pathology and his swallowing mechanism and upper esophageal function is okay but he does have some obstruction distally. Dysphagia seems to be his main problem. We discussed what we could do think we could go when an repair of this epigastric hernia and dissected forgot probably Baron Hamperrotter repair of recurrent hiatal hernia of possible and then create his either take down his fundoplication completely or converted to a 270. Those things will be dictated by the operative findings at the time. He is wearing a heart monitor for another few weeks after his lung infection this year.   We'll plan to do  repair of epigastric hernia and with takedown of Nissen fundoplication and repair of hiatal hernia if present.   Allergies Lisinopril *ANTIHYPERTENSIVES* Penicillamine *Assorted Classes** OxyCODONE HCl (Abuse Deter) *ANALGESICS - OPIOID*  Medication History  ProAir HFA (108 (90 Base)MCG/ACT Aerosol Soln, Inhalation) Active. Metoprolol Succinate ER (100MG  Tablet ER 24HR, Oral) Active. NexIUM (40MG  Capsule DR, Oral as needed) Active. Ativan (0.5MG  Tablet, Oral as needed) Active. Cardura (1MG  Tablet, Oral daily) Active. Requip (0.25MG  Tablet, Oral daily) Active. Medications Reconciled  Vitals  08/24/2015 9:11 AM Weight: 201.4 lb Height: 72in Body Surface Area: 2.14 m Body Mass Index: 27.31 kg/m  Temp.: 98.74F(Oral)  Pulse: 777 (Regular)  BP: 116/82 (Sitting, Left Arm, Standard)  Physical Exam  General Note: Well-developed and maintained white  male in no acute distress.   Head and Neck Note: Normocephalic, neck without masses   Eye Note: Pupils equal round and reactive to light   Chest and Lung Exam Note: Breath sounds equal bilaterally. There is a monitor along the pectoralis muscle on the left side that is a heart monitor   Cardiovascular Note: Sinus rhythm without murmurs or gallops   Abdomen Note: Small epigastric hernia just above the umbilicus and slightly to the right consistent with a trocar hernia   Musculoskeletal Note: Extremity exam as missing distal interphalangeal joints on hand, no DVT full range of motion otherwise     Assessment & Plan Eric Rollins(Nela Bascom B. Sieara Bremer MD;  HIATAL HERNIA WITH GERD (K21.9) Impression: Plan to repair hiatal hernia if possible and partially or totally takedown the Nissen EPIGASTRIC HERNIA (K43.9) Impression: repair of epigastric hernia

## 2016-01-13 ENCOUNTER — Encounter (HOSPITAL_COMMUNITY)
Admission: RE | Disposition: A | Discharge status: Home / Self Care | Payer: Self-pay | Source: Ambulatory Visit | Attending: Surgery

## 2016-01-13 ENCOUNTER — Encounter (HOSPITAL_COMMUNITY): Payer: Self-pay | Admitting: *Deleted

## 2016-01-13 ENCOUNTER — Ambulatory Visit (HOSPITAL_COMMUNITY): Payer: BLUE CROSS/BLUE SHIELD | Admitting: Registered Nurse

## 2016-01-13 ENCOUNTER — Observation Stay (HOSPITAL_COMMUNITY)
Admission: RE | Admit: 2016-01-13 | Discharge: 2016-01-15 | Disposition: A | Discharge status: Home / Self Care | Payer: BLUE CROSS/BLUE SHIELD | Source: Ambulatory Visit | Attending: Surgery | Admitting: Surgery

## 2016-01-13 DIAGNOSIS — M1990 Unspecified osteoarthritis, unspecified site: Secondary | ICD-10-CM | POA: Insufficient documentation

## 2016-01-13 DIAGNOSIS — Z87891 Personal history of nicotine dependence: Secondary | ICD-10-CM | POA: Diagnosis not present

## 2016-01-13 DIAGNOSIS — I129 Hypertensive chronic kidney disease with stage 1 through stage 4 chronic kidney disease, or unspecified chronic kidney disease: Secondary | ICD-10-CM | POA: Insufficient documentation

## 2016-01-13 DIAGNOSIS — K449 Diaphragmatic hernia without obstruction or gangrene: Secondary | ICD-10-CM | POA: Diagnosis not present

## 2016-01-13 DIAGNOSIS — G4730 Sleep apnea, unspecified: Secondary | ICD-10-CM | POA: Diagnosis not present

## 2016-01-13 DIAGNOSIS — R1310 Dysphagia, unspecified: Secondary | ICD-10-CM | POA: Insufficient documentation

## 2016-01-13 DIAGNOSIS — K219 Gastro-esophageal reflux disease without esophagitis: Secondary | ICD-10-CM | POA: Diagnosis not present

## 2016-01-13 DIAGNOSIS — Z9889 Other specified postprocedural states: Secondary | ICD-10-CM

## 2016-01-13 DIAGNOSIS — F419 Anxiety disorder, unspecified: Secondary | ICD-10-CM | POA: Insufficient documentation

## 2016-01-13 DIAGNOSIS — Z8719 Personal history of other diseases of the digestive system: Secondary | ICD-10-CM

## 2016-01-13 HISTORY — PX: UPPER GI ENDOSCOPY: SHX6162

## 2016-01-13 LAB — CREATININE, SERUM
Creatinine, Ser: 1.51 mg/dL — ABNORMAL HIGH (ref 0.61–1.24)
GFR calc Af Amer: 57 mL/min — ABNORMAL LOW (ref >60)
GFR calc non Af Amer: 49 mL/min — ABNORMAL LOW (ref >60)

## 2016-01-13 LAB — CBC
HEMATOCRIT: 41.6 % (ref 39.0–52.0)
HEMOGLOBIN: 14.4 g/dL (ref 13.0–17.0)
MCH: 31.2 pg (ref 26.0–34.0)
MCHC: 34.6 g/dL (ref 30.0–36.0)
MCV: 90.2 fL (ref 78.0–100.0)
Platelets: 189 10*3/uL (ref 150–400)
RBC: 4.61 MIL/uL (ref 4.22–5.81)
RDW: 12.2 % (ref 11.5–15.5)
WBC: 10 10*3/uL (ref 4.0–10.5)

## 2016-01-13 SURGERY — FUNDOPLICATION, NISSEN, ROBOT-ASSISTED, LAPAROSCOPIC
Anesthesia: General | Site: Esophagus

## 2016-01-13 MED ORDER — ROCURONIUM BROMIDE 10 MG/ML (PF) SYRINGE
PREFILLED_SYRINGE | INTRAVENOUS | Status: AC
  Filled 2016-01-13: qty 10

## 2016-01-13 MED ORDER — METOPROLOL SUCCINATE ER 50 MG PO TB24
100.0000 mg | ORAL_TABLET | Freq: Every morning | ORAL | Status: DC
  Administered 2016-01-14 – 2016-01-15 (×2): 100 mg via ORAL
  Filled 2016-01-13 (×2): qty 2

## 2016-01-13 MED ORDER — HEPARIN SODIUM (PORCINE) 5000 UNIT/ML IJ SOLN
5000.0000 [IU] | Freq: Three times a day (TID) | INTRAMUSCULAR | Status: DC
  Administered 2016-01-13 – 2016-01-15 (×5): 5000 [IU] via SUBCUTANEOUS
  Filled 2016-01-13 (×5): qty 1

## 2016-01-13 MED ORDER — EPHEDRINE SULFATE-NACL 50-0.9 MG/10ML-% IV SOSY
PREFILLED_SYRINGE | INTRAVENOUS | Status: DC | PRN
  Administered 2016-01-13 (×2): 5 mg via INTRAVENOUS
  Administered 2016-01-13: 10 mg via INTRAVENOUS

## 2016-01-13 MED ORDER — METOCLOPRAMIDE HCL 5 MG/ML IJ SOLN
INTRAMUSCULAR | Status: AC
  Filled 2016-01-13: qty 2

## 2016-01-13 MED ORDER — BUPIVACAINE LIPOSOME 1.3 % IJ SUSP
20.0000 mL | Freq: Once | INTRAMUSCULAR | Status: AC
  Administered 2016-01-13: 20 mL
  Filled 2016-01-13: qty 20

## 2016-01-13 MED ORDER — PROPOFOL 10 MG/ML IV BOLUS
INTRAVENOUS | Status: AC
  Filled 2016-01-13: qty 20

## 2016-01-13 MED ORDER — PROPOFOL 10 MG/ML IV BOLUS
INTRAVENOUS | Status: DC | PRN
  Administered 2016-01-13: 220 mg via INTRAVENOUS

## 2016-01-13 MED ORDER — CIPROFLOXACIN IN D5W 400 MG/200ML IV SOLN
INTRAVENOUS | Status: AC
  Filled 2016-01-13: qty 200

## 2016-01-13 MED ORDER — SODIUM CHLORIDE 0.9 % IJ SOLN
INTRAMUSCULAR | Status: AC
  Filled 2016-01-13: qty 10

## 2016-01-13 MED ORDER — SCOPOLAMINE 1 MG/3DAYS TD PT72
MEDICATED_PATCH | TRANSDERMAL | Status: AC
  Filled 2016-01-13: qty 1

## 2016-01-13 MED ORDER — HYDROMORPHONE HCL 1 MG/ML IJ SOLN
INTRAMUSCULAR | Status: AC
  Filled 2016-01-13: qty 1

## 2016-01-13 MED ORDER — METOCLOPRAMIDE HCL 5 MG/ML IJ SOLN
INTRAMUSCULAR | Status: DC | PRN
  Administered 2016-01-13: 10 mg via INTRAVENOUS

## 2016-01-13 MED ORDER — EPHEDRINE 5 MG/ML INJ
INTRAVENOUS | Status: AC
  Filled 2016-01-13: qty 10

## 2016-01-13 MED ORDER — DEXAMETHASONE SODIUM PHOSPHATE 10 MG/ML IJ SOLN
INTRAMUSCULAR | Status: DC | PRN
  Administered 2016-01-13: 10 mg via INTRAVENOUS

## 2016-01-13 MED ORDER — ONDANSETRON HCL 4 MG/2ML IJ SOLN
4.0000 mg | Freq: Once | INTRAMUSCULAR | Status: AC | PRN
  Administered 2016-01-13: 4 mg via INTRAVENOUS

## 2016-01-13 MED ORDER — ONDANSETRON HCL 4 MG/2ML IJ SOLN
4.0000 mg | Freq: Four times a day (QID) | INTRAMUSCULAR | Status: DC | PRN
  Administered 2016-01-13: 4 mg via INTRAVENOUS
  Filled 2016-01-13: qty 2

## 2016-01-13 MED ORDER — MIDAZOLAM HCL 5 MG/5ML IJ SOLN
INTRAMUSCULAR | Status: DC | PRN
  Administered 2016-01-13: 2 mg via INTRAVENOUS

## 2016-01-13 MED ORDER — MIDAZOLAM HCL 2 MG/2ML IJ SOLN
INTRAMUSCULAR | Status: AC
  Filled 2016-01-13: qty 2

## 2016-01-13 MED ORDER — FENTANYL CITRATE (PF) 100 MCG/2ML IJ SOLN
INTRAMUSCULAR | Status: DC | PRN
  Administered 2016-01-13 (×5): 50 ug via INTRAVENOUS

## 2016-01-13 MED ORDER — LACTATED RINGERS IV SOLN
INTRAVENOUS | Status: DC | PRN
  Administered 2016-01-13: 07:00:00 via INTRAVENOUS

## 2016-01-13 MED ORDER — LACTATED RINGERS IV SOLN: INTRAVENOUS | Status: DC

## 2016-01-13 MED ORDER — LIDOCAINE HCL (CARDIAC) 20 MG/ML IV SOLN
INTRAVENOUS | Status: DC | PRN
  Administered 2016-01-13: 100 mg via INTRAVENOUS

## 2016-01-13 MED ORDER — KCL IN DEXTROSE-NACL 20-5-0.45 MEQ/L-%-% IV SOLN
INTRAVENOUS | Status: DC
  Administered 2016-01-13 – 2016-01-14 (×3): via INTRAVENOUS
  Filled 2016-01-13 (×4): qty 1000

## 2016-01-13 MED ORDER — ONDANSETRON 4 MG PO TBDP: 4.0000 mg | ORAL_TABLET | Freq: Four times a day (QID) | ORAL | Status: DC | PRN

## 2016-01-13 MED ORDER — GABAPENTIN 300 MG PO CAPS
300.0000 mg | ORAL_CAPSULE | ORAL | Status: AC
  Administered 2016-01-13: 300 mg via ORAL
  Filled 2016-01-13: qty 1

## 2016-01-13 MED ORDER — HEPARIN SODIUM (PORCINE) 5000 UNIT/ML IJ SOLN
5000.0000 [IU] | Freq: Once | INTRAMUSCULAR | Status: AC
  Administered 2016-01-13: 5000 [IU] via SUBCUTANEOUS
  Filled 2016-01-13: qty 1

## 2016-01-13 MED ORDER — CIPROFLOXACIN IN D5W 400 MG/200ML IV SOLN
400.0000 mg | INTRAVENOUS | Status: AC
  Administered 2016-01-13: 400 mg via INTRAVENOUS

## 2016-01-13 MED ORDER — DEXAMETHASONE SODIUM PHOSPHATE 10 MG/ML IJ SOLN
INTRAMUSCULAR | Status: AC
  Filled 2016-01-13: qty 1

## 2016-01-13 MED ORDER — HYDROMORPHONE HCL 1 MG/ML IJ SOLN
0.2500 mg | INTRAMUSCULAR | Status: DC | PRN
  Administered 2016-01-13 (×2): 0.5 mg via INTRAVENOUS

## 2016-01-13 MED ORDER — LORAZEPAM 0.5 MG PO TABS
0.5000 mg | ORAL_TABLET | Freq: Four times a day (QID) | ORAL | Status: DC | PRN
  Administered 2016-01-13 – 2016-01-14 (×2): 0.5 mg via ORAL
  Filled 2016-01-13 (×2): qty 1

## 2016-01-13 MED ORDER — CELECOXIB 200 MG PO CAPS
400.0000 mg | ORAL_CAPSULE | ORAL | Status: AC
  Administered 2016-01-13: 400 mg via ORAL
  Filled 2016-01-13: qty 2

## 2016-01-13 MED ORDER — LACTATED RINGERS IR SOLN
Status: DC | PRN
  Administered 2016-01-13: 1000 mL

## 2016-01-13 MED ORDER — DEXMEDETOMIDINE HCL IN NACL 200 MCG/50ML IV SOLN
INTRAVENOUS | Status: AC
  Filled 2016-01-13: qty 50

## 2016-01-13 MED ORDER — FENTANYL CITRATE (PF) 100 MCG/2ML IJ SOLN
INTRAMUSCULAR | Status: AC
  Filled 2016-01-13: qty 4

## 2016-01-13 MED ORDER — FENTANYL CITRATE (PF) 100 MCG/2ML IJ SOLN
INTRAMUSCULAR | Status: AC
  Filled 2016-01-13: qty 2

## 2016-01-13 MED ORDER — ROCURONIUM BROMIDE 10 MG/ML (PF) SYRINGE
PREFILLED_SYRINGE | INTRAVENOUS | Status: AC
Start: 2016-01-13 — End: 2016-01-13
  Filled 2016-01-13: qty 10

## 2016-01-13 MED ORDER — ONDANSETRON HCL 4 MG/2ML IJ SOLN
INTRAMUSCULAR | Status: AC
Start: 2016-01-13 — End: 2016-01-14
  Filled 2016-01-13: qty 2

## 2016-01-13 MED ORDER — DEXMEDETOMIDINE HCL 200 MCG/2ML IV SOLN
INTRAVENOUS | Status: DC | PRN
  Administered 2016-01-13 (×5): 4 ug via INTRAVENOUS

## 2016-01-13 MED ORDER — SUCCINYLCHOLINE CHLORIDE 200 MG/10ML IV SOSY
PREFILLED_SYRINGE | INTRAVENOUS | Status: DC | PRN
  Administered 2016-01-13: 100 mg via INTRAVENOUS

## 2016-01-13 MED ORDER — ONDANSETRON HCL 4 MG/2ML IJ SOLN
INTRAMUSCULAR | Status: AC
  Filled 2016-01-13: qty 2

## 2016-01-13 MED ORDER — SODIUM CHLORIDE 0.9 % IJ SOLN
INTRAMUSCULAR | Status: DC | PRN
  Administered 2016-01-13: 10 mL

## 2016-01-13 MED ORDER — SCOPOLAMINE 1 MG/3DAYS TD PT72
MEDICATED_PATCH | TRANSDERMAL | Status: DC | PRN
  Administered 2016-01-13: 1 via TRANSDERMAL

## 2016-01-13 MED ORDER — ROCURONIUM BROMIDE 10 MG/ML (PF) SYRINGE
PREFILLED_SYRINGE | INTRAVENOUS | Status: DC | PRN
  Administered 2016-01-13: 40 mg via INTRAVENOUS
  Administered 2016-01-13 (×3): 20 mg via INTRAVENOUS

## 2016-01-13 MED ORDER — ACETAMINOPHEN 500 MG PO TABS
1000.0000 mg | ORAL_TABLET | ORAL | Status: AC
  Administered 2016-01-13: 1000 mg via ORAL
  Filled 2016-01-13: qty 2

## 2016-01-13 MED ORDER — HYDROMORPHONE HCL 1 MG/ML IJ SOLN
1.0000 mg | INTRAMUSCULAR | Status: DC | PRN
  Administered 2016-01-13 – 2016-01-15 (×6): 1 mg via INTRAVENOUS
  Filled 2016-01-13 (×6): qty 1

## 2016-01-13 MED ORDER — LIDOCAINE 2% (20 MG/ML) 5 ML SYRINGE
INTRAMUSCULAR | Status: AC
  Filled 2016-01-13: qty 5

## 2016-01-13 MED ORDER — MENTHOL 3 MG MT LOZG
1.0000 | LOZENGE | OROMUCOSAL | Status: DC | PRN
  Filled 2016-01-13: qty 9

## 2016-01-13 MED ORDER — SUGAMMADEX SODIUM 200 MG/2ML IV SOLN
INTRAVENOUS | Status: AC
Start: 2016-01-13 — End: 2016-01-13
  Filled 2016-01-13: qty 2

## 2016-01-13 MED ORDER — ONDANSETRON HCL 4 MG/2ML IJ SOLN
INTRAMUSCULAR | Status: DC | PRN
  Administered 2016-01-13: 4 mg via INTRAVENOUS

## 2016-01-13 MED ORDER — 0.9 % SODIUM CHLORIDE (POUR BTL) OPTIME
TOPICAL | Status: DC | PRN
  Administered 2016-01-13: 1000 mL

## 2016-01-13 SURGICAL SUPPLY — 59 items
APPLIER CLIP 5 13 M/L LIGAMAX5 (MISCELLANEOUS)
APPLIER CLIP ROT 13.4 12 LRG (CLIP)
BLADE SURG 15 STRL LF DISP TIS (BLADE) ×2 IMPLANT
BLADE SURG 15 STRL SS (BLADE) ×2
CLIP APPLIE 5 13 M/L LIGAMAX5 (MISCELLANEOUS) IMPLANT
CLIP APPLIE ROT 13.4 12 LRG (CLIP) IMPLANT
CLIP LIGATING HEM O LOK PURPLE (MISCELLANEOUS) IMPLANT
COVER TIP SHEARS 8 DVNC (MISCELLANEOUS) ×2 IMPLANT
COVER TIP SHEARS 8MM DA VINCI (MISCELLANEOUS) ×2
DECANTER SPIKE VIAL GLASS SM (MISCELLANEOUS) ×4 IMPLANT
DERMABOND ADVANCED (GAUZE/BANDAGES/DRESSINGS) ×2
DERMABOND ADVANCED .7 DNX12 (GAUZE/BANDAGES/DRESSINGS) ×2 IMPLANT
DEVICE TROCAR PUNCTURE CLOSURE (ENDOMECHANICALS) IMPLANT
DRAIN PENROSE 18X1/2 LTX STRL (DRAIN) ×4 IMPLANT
DRAPE ARM DVNC X/XI (DISPOSABLE) ×8 IMPLANT
DRAPE COLUMN DVNC XI (DISPOSABLE) ×2 IMPLANT
DRAPE DA VINCI XI ARM (DISPOSABLE) ×8
DRAPE DA VINCI XI COLUMN (DISPOSABLE) ×2
DRAPE SHEET LG 3/4 BI-LAMINATE (DRAPES) IMPLANT
ELECT REM PT RETURN 15FT ADLT (MISCELLANEOUS) ×4 IMPLANT
ENDOLOOP SUT PDS II  0 18 (SUTURE)
ENDOLOOP SUT PDS II 0 18 (SUTURE) IMPLANT
GAUZE SPONGE 4X4 16PLY XRAY LF (GAUZE/BANDAGES/DRESSINGS) ×4 IMPLANT
GLOVE BIOGEL M 8.0 STRL (GLOVE) ×8 IMPLANT
GOWN STRL REUS W/TWL XL LVL3 (GOWN DISPOSABLE) ×16 IMPLANT
IRRIG SUCT STRYKERFLOW 2 WTIP (MISCELLANEOUS) ×4
IRRIGATION SUCT STRKRFLW 2 WTP (MISCELLANEOUS) ×2 IMPLANT
KIT BASIN OR (CUSTOM PROCEDURE TRAY) ×4 IMPLANT
LIQUID BAND (GAUZE/BANDAGES/DRESSINGS) ×4 IMPLANT
MARKER SKIN DUAL TIP RULER LAB (MISCELLANEOUS) ×4 IMPLANT
NEEDLE HYPO 22GX1.5 SAFETY (NEEDLE) ×4 IMPLANT
PACK CARDIOVASCULAR III (CUSTOM PROCEDURE TRAY) ×4 IMPLANT
PAD POSITIONING PINK XL (MISCELLANEOUS) ×4 IMPLANT
SCISSORS LAP 5X45 EPIX DISP (ENDOMECHANICALS) ×4 IMPLANT
SEAL CANN UNIV 5-8 DVNC XI (MISCELLANEOUS) ×8 IMPLANT
SEAL XI 5MM-8MM UNIVERSAL (MISCELLANEOUS) ×8
SEALER VESSEL DA VINCI XI (MISCELLANEOUS) ×2
SEALER VESSEL EXT DVNC XI (MISCELLANEOUS) ×2 IMPLANT
SET BI-LUMEN FLTR TB AIRSEAL (TUBING) ×4 IMPLANT
SOLUTION ANTI FOG 6CC (MISCELLANEOUS) ×4 IMPLANT
SOLUTION ELECTROLUBE (MISCELLANEOUS) ×4 IMPLANT
SUT ETHIBOND 0 36 GRN (SUTURE) IMPLANT
SUT ETHIBOND 2 0 SH (SUTURE)
SUT ETHIBOND 2 0 SH 36X2 (SUTURE) IMPLANT
SUT VIC AB 4-0 SH 18 (SUTURE) ×4 IMPLANT
SYR 20CC LL (SYRINGE) ×4 IMPLANT
TIP INNERVISION DETACH 40FR (MISCELLANEOUS) IMPLANT
TIP INNERVISION DETACH 50FR (MISCELLANEOUS) IMPLANT
TIP INNERVISION DETACH 56FR (MISCELLANEOUS) IMPLANT
TIPS INNERVISION DETACH 40FR (MISCELLANEOUS)
TOWEL OR 17X26 10 PK STRL BLUE (TOWEL DISPOSABLE) ×4 IMPLANT
TOWEL OR NON WOVEN STRL DISP B (DISPOSABLE) ×4 IMPLANT
TRAY FOLEY W/METER SILVER 16FR (SET/KITS/TRAYS/PACK) IMPLANT
TROCAR ADV FIXATION 12X100MM (TROCAR) IMPLANT
TROCAR ADV FIXATION 5X100MM (TROCAR) IMPLANT
TROCAR BLADELESS OPT 5 100 (ENDOMECHANICALS) ×4 IMPLANT
TUBING CONNECTING 10 (TUBING) ×3 IMPLANT
TUBING CONNECTING 10' (TUBING) ×1
TUBING ENDO SMARTCAP PENTAX (MISCELLANEOUS) ×4 IMPLANT

## 2016-01-13 NOTE — Interval H&P Note (Signed)
History and Physical Interval Note:  01/13/2016 7:27 AM  Eric MaxinBilly Hinze  has presented today for surgery, with the diagnosis of dysplagia and hiatal hernia  The various methods of treatment have been discussed with the patient and family. After consideration of risks, benefits and other options for treatment, the patient has consented to  Procedure(s): XI ROBOTIC ASSISTED LAPAROSCOPIC NISSEN FUNDOPLICATION TAKEDOWN EPIGASTRIC AND REPAIR OF DIAPHRAGM (N/A) as a surgical intervention .  The patient's history has been reviewed, patient examined, no change in status, stable for surgery.  I have reviewed the patient's chart and labs.  Questions were answered to the patient's satisfaction.     Danicia Terhaar B

## 2016-01-13 NOTE — Anesthesia Postprocedure Evaluation (Signed)
Anesthesia Post Note  Patient: Doctor, hospitalBilly Rollins  Procedure(s) Performed: Procedure(s) (LRB): XI ROBOTIC REPAIR OF A  REDO HIATAL HERNIA REPAIR (N/A) UPPER GI ENDOSCOPY  Patient location during evaluation: PACU Anesthesia Type: General Level of consciousness: awake and alert Pain management: pain level controlled Vital Signs Assessment: post-procedure vital signs reviewed and stable Respiratory status: spontaneous breathing, nonlabored ventilation, respiratory function stable and patient connected to nasal cannula oxygen Cardiovascular status: blood pressure returned to baseline and stable Postop Assessment: no signs of nausea or vomiting Anesthetic complications: no    Last Vitals:  Vitals:   01/13/16 0510 01/13/16 1152  BP: (!) 162/96 (!) 150/98  Pulse: 70 99  Resp: 16 (!) 23  Temp: 36.7 C 36.9 C    Last Pain:  Vitals:   01/13/16 1152  TempSrc:   PainSc: 0-No pain                 Reino KentJudd, Marqui Formby J

## 2016-01-13 NOTE — Anesthesia Procedure Notes (Signed)
Procedure Name: Intubation Date/Time: 01/13/2016 7:41 AM Performed by: Jarvis NewcomerARMISTEAD, Franchot Pollitt A Pre-anesthesia Checklist: Patient identified, Timeout performed, Emergency Drugs available, Suction available and Patient being monitored Patient Re-evaluated:Patient Re-evaluated prior to inductionOxygen Delivery Method: Circle system utilized Preoxygenation: Pre-oxygenation with 100% oxygen Intubation Type: IV induction, Rapid sequence and Cricoid Pressure applied Laryngoscope Size: Mac and 4 Grade View: Grade II Tube type: Oral Tube size: 7.5 mm Number of attempts: 2 Airway Equipment and Method: Stylet Placement Confirmation: ETT inserted through vocal cords under direct vision,  positive ETCO2 and breath sounds checked- equal and bilateral Secured at: 23 cm Tube secured with: Tape Dental Injury: Teeth and Oropharynx as per pre-operative assessment  Comments: RSI with cricoid pressure. DL X 1 by CRNA with MAC 4. Grade 2 view. - ETCO2. ETT removed and masked with ease. DL x 1 with MAC 4 by Dr. Gentry RochJudd after head cradle removed. Grade 2 view. ATOI. ETT secured at 23 cm at the lip.

## 2016-01-13 NOTE — Transfer of Care (Signed)
Immediate Anesthesia Transfer of Care Note  Patient: Eric Rollins  Procedure(s) Performed: Procedure(s): XI ROBOTIC REPAIR OF A  REDO HIATAL HERNIA REPAIR (N/A) UPPER GI ENDOSCOPY  Patient Location: PACU  Anesthesia Type:General  Level of Consciousness: awake, alert , oriented and patient cooperative  Airway & Oxygen Therapy: Patient Spontanous Breathing and Patient connected to face mask oxygen  Post-op Assessment: Report given to RN, Post -op Vital signs reviewed and stable and Patient moving all extremities  Post vital signs: Reviewed and stable  Last Vitals:  Vitals:   01/13/16 0510 01/13/16 1152  BP: (!) 162/96 (!) 150/98  Pulse: 70 99  Resp: 16 (!) 23  Temp: 36.7 C     Last Pain:  Vitals:   01/13/16 0531  TempSrc:   PainSc: 3       Patients Stated Pain Goal: 3 (01/13/16 0531)  Complications: No apparent anesthesia complications

## 2016-01-13 NOTE — Op Note (Signed)
Surgeon: Eric LowMatt Devery Murgia, MD, FACS  Asst:  Ovidio Kinavid Newman M.D. FACS  Anes:  Gen.  Procedure: Upper endoscopy;  Robotic dissection of the foregut with repair?reducttion of a hiatal hernia (twice recurrent)  Diagnosis: Prior Nissen fundoplications with dysphagia  Complications: none  EBL:   minimal cc  Drains: none  Description of Procedure:  [facility]  After anesthesia was administered and the patient was prepped a timeout was performed.  Access to the abdomen was achieved with a 5 mm Optiview through the left upper quadrant. 8 mm robotic ports were patent placed approximately 10 cm apart and able linear fashion across midline with a camera port at the umbilicus first port to the right 3 and 4 on the left side. Subsequently a put in the right lower quadrant 5 mm port for Dr. Ezzard StandingNewman as assistant.  The robotic X I was brought in and docked and targeted. I managed to consult Dr. Ezzard StandingNewman was my assistant. We placed a Nathanson retractor also prior to docking. This was secured on the patient's right side. With the liver retracted I began taking down adhesions to the left lateral segment with sharp dissection. I worked my way up and over the course of 3 hours and dissected the foregut, and all the adhesions. I then came upon the hiatal hernia where the wrap seemed to have been pulled up into the chest. I dissected that free and was able to get underneath it put a Penrose drain around it on the esophagus. Holding esophagus and the wrap and the abdomen then was able to free the left crus and my visualization point started with a timeout that I could identify. I placed 2 sutures posteriorly which significantly closed the hiatus.  Since the patient has had dysphagia elected not to do any more to his wrap. I had as a first moved before even doing the robotic procedure I passed the endoscope endoscoped him and look at the wrap and retroflexion. The laparoscope in place. After we had done  this dissection and completed the closure into tremendous endoscope by: Intravenous stomach began looking at things. The impairment at the EG junction seemed to be resolved with the closure. The there was no evidence of any injury and no bubbles or evidence of leaks. Elected to complete the operation at this point removing the [facility] retractor and didn't fill infiltrating all the port sites with expiratory L. There was no ventral hernia that I saw in this pocket in the point that I had marked. There is scarcity of adhesions in the abdomen and no other abnormalities were noted. The gallbladder appeared to be unremarkable. Following deflation was closed the port sites with 5-0 Monocryl and liquid band. Patient are the procedure well taken to the recovery room in satisfactory condition.  The patient tolerated the procedure well and was taken to the PACU in stable condition.     [name], MD, [facility], [place] [phone]

## 2016-01-13 NOTE — Anesthesia Preprocedure Evaluation (Addendum)
Anesthesia Evaluation  Patient identified by MRN, date of birth, ID band Patient awake    Reviewed: Allergy & Precautions, H&P , NPO status , Patient's Chart, lab work & pertinent test results  Airway Mallampati: I  TM Distance: >3 FB Neck ROM: Full    Dental no notable dental hx.    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     GI/Hepatic Neg liver ROS, hiatal hernia, neg GERD  ,  Endo/Other  negative endocrine ROS  Renal/GU Renal InsufficiencyRenal disease  negative genitourinary   Musculoskeletal negative musculoskeletal ROS (+) Arthritis ,   Abdominal   Peds negative pediatric ROS (+)  Hematology negative hematology ROS (+)   Anesthesia Other Findings Has not used CPAP in past year  Reproductive/Obstetrics negative OB ROS                             Anesthesia Physical  Anesthesia Plan  ASA: III  Anesthesia Plan: General   Post-op Pain Management:    Induction: Intravenous, Rapid sequence and Cricoid pressure planned  Airway Management Planned: Oral ETT  Additional Equipment:   Intra-op Plan:   Post-operative Plan: Extubation in OR  Informed Consent: I have reviewed the patients History and Physical, chart, labs and discussed the procedure including the risks, benefits and alternatives for the proposed anesthesia with the patient or authorized representative who has indicated his/her understanding and acceptance.   Dental advisory given  Plan Discussed with: CRNA  Anesthesia Plan Comments:         Anesthesia Quick Evaluation

## 2016-01-14 DIAGNOSIS — K449 Diaphragmatic hernia without obstruction or gangrene: Secondary | ICD-10-CM | POA: Diagnosis not present

## 2016-01-14 MED ORDER — PNEUMOCOCCAL VAC POLYVALENT 25 MCG/0.5ML IJ INJ
0.5000 mL | INJECTION | INTRAMUSCULAR | Status: AC
  Administered 2016-01-15: 0.5 mL via INTRAMUSCULAR
  Filled 2016-01-14 (×2): qty 0.5

## 2016-01-14 MED ORDER — INFLUENZA VAC SPLIT QUAD 0.5 ML IM SUSY
0.5000 mL | PREFILLED_SYRINGE | INTRAMUSCULAR | Status: AC
  Administered 2016-01-15: 0.5 mL via INTRAMUSCULAR
  Filled 2016-01-14: qty 0.5

## 2016-01-14 MED ORDER — OXYCODONE HCL 5 MG/5ML PO SOLN
5.0000 mg | ORAL | Status: DC | PRN
  Administered 2016-01-14 (×2): 5 mg via ORAL
  Filled 2016-01-14 (×2): qty 5

## 2016-01-14 NOTE — Progress Notes (Signed)
Patient ID: Eric Rollins, male   DOB: [birth year], 60 y.o.   MRN: [id number] [facility] Progress Note:   1 Day Post-Op  Subjective: Mental status is alert Objective: Vital signs in last 24 hours: Temp:  [97.8 F (36.6 C)-98.6 F (37 C)] 97.8 F (36.6 C) (09/30 0503) Pulse Rate:  [63-99] 63 (09/30 0503) Resp:  [13-23] 16 (09/30 0503) BP: (92-150)/(57-98) 115/68 (09/30 0503) SpO2:  [91 %-100 %] 96 % (09/30 0503)  Intake/Output from previous day: 09/29 0701 - 09/30 0700 In: 3138.8 [P.O.:120; I.V.:3018.8] Out: 120 [Urine:95; Blood:25] Intake/Output this shift: No intake/output data recorded.  Physical Exam: Work of breathing is normal.  Minimal pain  Lab Results:  Results for orders placed or performed during the hospital encounter of 01/13/16 (from the past 48 hour(s))  CBC     Status: None   Collection Time: 01/13/16  2:23 PM  Result Value Ref Range   WBC 10.0 4.0 - 10.5 K/uL   RBC 4.61 4.22 - 5.81 MIL/uL   Hemoglobin 14.4 13.0 - 17.0 g/dL   HCT 41.6 39.0 - 52.0 %   MCV 90.2 78.0 - 100.0 fL   MCH 31.2 26.0 - 34.0 pg   MCHC 34.6 30.0 - 36.0 g/dL   RDW 12.2 11.5 - 15.5 %   Platelets 189 150 - 400 K/uL  Creatinine, serum     Status: Abnormal   Collection Time: 01/13/16  2:23 PM  Result Value Ref Range   Creatinine, Ser 1.51 (H) 0.61 - 1.24 mg/dL   GFR calc non Af Amer 49 (L) >60 mL/min   GFR calc Af Amer 57 (L) >60 mL/min    Comment: (NOTE) The eGFR has been calculated using the CKD EPI equation. This calculation has not been validated in all clinical situations. eGFR's persistently <60 mL/min signify possible Chronic Kidney Disease.     Radiology/Results: No results found.  Anti-infectives: Anti-infectives    Start     Dose/Rate Route Frequency Ordered Stop   01/13/16 0506  ciprofloxacin (CIPRO) IVPB 400 mg     400 mg 200 mL/hr over 60 Minutes Intravenous On call to O.R. 01/13/16 0506 01/13/16 0743      Assessment/Plan: Problem List: Patient  Active Problem List   Diagnosis Date Noted  . History of repair of hiatal hernia 01/13/2016  . GERD (gastroesophageal reflux disease)   . Chronic kidney disease   . Sleep apnea   . Hypertension   . Panic attacks   . Recurrent Hiatal hernia s/p redo Nissen and hiatal hernia closure 12/04/2013 12/04/2013    Diet advanced a little too quickly.  Will back off to full liquids.  Hopeful discharge tomorrow.  1 Day Post-Op    LOS: 0 days   Matt B. Hassell Done, MD, [facility] [phone] beeper [phone]  01/14/2016 9:00 AM

## 2016-01-14 NOTE — Progress Notes (Signed)
Pt c/o slowness to urinate. Unsure if he is able to empty bladder.  Bladder scan done- 26cc.  Asked pt to save urine from now on

## 2016-01-15 DIAGNOSIS — K449 Diaphragmatic hernia without obstruction or gangrene: Secondary | ICD-10-CM | POA: Diagnosis not present

## 2016-01-15 MED ORDER — OXYCODONE HCL 5 MG/5ML PO SOLN: 5.0000 mg | ORAL | 0 refills | Status: DC | PRN

## 2016-01-15 MED ORDER — HYDROMORPHONE HCL 1 MG/ML PO LIQD: 1.0000 mg | ORAL | 0 refills | Status: DC | PRN

## 2016-01-15 MED ORDER — DIPHENHYDRAMINE HCL 25 MG PO CAPS
25.0000 mg | ORAL_CAPSULE | Freq: Four times a day (QID) | ORAL | Status: DC | PRN
  Administered 2016-01-15: 25 mg via ORAL
  Filled 2016-01-15: qty 1

## 2016-01-15 MED ORDER — LORAZEPAM 0.5 MG PO TABS: 0.5000 mg | ORAL_TABLET | Freq: Three times a day (TID) | ORAL | 0 refills | Status: DC

## 2016-01-15 NOTE — Discharge Instructions (Signed)
You will need less physically stressful work in the future.

## 2016-01-15 NOTE — Discharge Summary (Signed)
Physician Discharge Summary  Patient ID: Eric MaxinBilly Berringer MRN: [id number] DOB/AGE: 59/07/1955 59 y.o.  Admit date: 01/13/2016 Discharge date: 01/15/2016  Admission Diagnoses:  Recurrent dysphagia   Discharge Diagnoses:  Herniation of wrap above the diaphragm  Active Problems:   History of repair of hiatal hernia   Surgery:  Robotic assisted repair of hiatal hernia  Discharged Condition: improved   Hospital Course:   Had surgery on Friday, started liquids on Saturday and ready to go home on Sunday.  Advised on pureed or soft diet and to avoid physically stressful activity  Consults: none  Significant Diagnostic Studies: none    Discharge Exam: Blood pressure 121/87, pulse 64, temperature 98 F (36.7 C), temperature source Oral, resp. rate 18, height 6' (1.829 m), weight 93.4 kg (206 lb), SpO2 94 %. Incisions OK.    Disposition: 01-Home or Self Care  Discharge Instructions    Diet - low sodium heart healthy    Complete by:  As directed    Pureed (soft) foods for 1-2 weeks.  Then advance to regular diet over the next 2 weeks.   Increase activity slowly    Complete by:  As directed        Medication List    TAKE these medications   BIOTENE ORALBALANCE DRY MOUTH Liqd Use as directed in the mouth or throat See admin instructions. He uses one spray at bedtime.   Cholecalciferol 1000 UNT/0.03ML Liqd Take 2,000 Units by mouth daily.   gabapentin 300 MG capsule Commonly known as:  NEURONTIN Take 300 mg by mouth at bedtime.   LORazepam 0.5 MG tablet Commonly known as:  ATIVAN Take 1 tablet (0.5 mg total) by mouth every 8 (eight) hours. What changed:  when to take this  reasons to take this   metoprolol succinate 100 MG 24 hr tablet Commonly known as:  TOPROL-XL Take 100 mg by mouth every morning.   NEXIUM 40 MG capsule Generic drug:  esomeprazole Take 40 mg by mouth every morning.   ondansetron 4 MG tablet Commonly known as:  ZOFRAN Take 1 tablet (4 mg total)  by mouth every 6 (six) hours. What changed:  when to take this  reasons to take this   OVER THE COUNTER MEDICATION Take 2,500 mcg by mouth every morning. Vitamin B-12 2500mcg   oxyCODONE 5 MG/5ML solution Commonly known as:  ROXICODONE Take 5 mLs (5 mg total) by mouth every 4 (four) hours as needed for severe pain.   oxyCODONE-acetaminophen 5-325 MG tablet Commonly known as:  ROXICET Take 1-2 tablets by mouth every 4 (four) hours as needed for moderate pain.   rOPINIRole 0.25 MG tablet Commonly known as:  REQUIP Take 0.25 mg by mouth at bedtime.   triamterene-hydrochlorothiazide 37.5-25 MG tablet Commonly known as:  MAXZIDE-25 Take 1 tablet by mouth every morning.        SignedValarie Merino: Nancy Manuele B 01/15/2016, 8:39 AM

## 2016-01-15 NOTE — Progress Notes (Signed)
Discharged from floor ambulatory for transport home via car. Belongings & wife with pt. No changes in assessment. Eric Rollins, [employer]

## 2016-04-06 ENCOUNTER — Other Ambulatory Visit: Payer: Self-pay | Admitting: Surgery

## 2016-04-06 DIAGNOSIS — R1310 Dysphagia, unspecified: Secondary | ICD-10-CM

## 2016-04-11 ENCOUNTER — Ambulatory Visit
Admission: RE | Admit: 2016-04-11 | Discharge: 2016-04-11 | Disposition: A | Discharge status: Home / Self Care | Payer: BLUE CROSS/BLUE SHIELD | Source: Ambulatory Visit | Attending: Surgery | Admitting: Surgery

## 2016-04-11 DIAGNOSIS — R1310 Dysphagia, unspecified: Secondary | ICD-10-CM

## 2017-03-21 ENCOUNTER — Other Ambulatory Visit: Payer: Self-pay | Admitting: Surgery

## 2017-03-21 DIAGNOSIS — R1011 Right upper quadrant pain: Secondary | ICD-10-CM

## 2017-03-27 ENCOUNTER — Ambulatory Visit
Admission: RE | Admit: 2017-03-27 | Discharge: 2017-03-27 | Disposition: A | Discharge status: Home / Self Care | Payer: BLUE CROSS/BLUE SHIELD | Source: Ambulatory Visit | Attending: Surgery | Admitting: Surgery

## 2017-03-27 DIAGNOSIS — R1011 Right upper quadrant pain: Secondary | ICD-10-CM

## 2017-05-03 ENCOUNTER — Other Ambulatory Visit (HOSPITAL_COMMUNITY): Payer: Self-pay | Admitting: Surgery

## 2017-05-03 DIAGNOSIS — R1010 Upper abdominal pain, unspecified: Secondary | ICD-10-CM

## 2017-05-16 ENCOUNTER — Encounter (HOSPITAL_COMMUNITY)
Admission: RE | Admit: 2017-05-16 | Discharge: 2017-05-16 | Disposition: A | Discharge status: Home / Self Care | Payer: BLUE CROSS/BLUE SHIELD | Source: Ambulatory Visit | Attending: Surgery | Admitting: Surgery

## 2017-05-16 DIAGNOSIS — R1010 Upper abdominal pain, unspecified: Secondary | ICD-10-CM | POA: Diagnosis not present

## 2017-05-16 MED ORDER — TECHNETIUM TC 99M MEBROFENIN IV KIT
5.4300 | PACK | Freq: Once | INTRAVENOUS | Status: AC | PRN
  Administered 2017-05-16: 5.43 via INTRAVENOUS

## 2017-12-31 ENCOUNTER — Telehealth: Payer: Self-pay | Admitting: Gastroenterology

## 2017-12-31 NOTE — Telephone Encounter (Signed)
Received referral in proficient for patient. Patient states that he isn't getting any answers to his diverticulitis problems at previous gi office and wants to come here, but is not requesting certain MD. Patient last seen by [facility] 1.23.19 of this year. Records faxed [phone] and DOD that day was Dr.Mansouraty. Records in care everywhere and placed on Dr.Mansouraty's desk for review. Please advise if okay to schedule pt for OV.

## 2017-12-31 NOTE — Telephone Encounter (Signed)
Thank you Irving Burtonmily. I have reviewed the documents that were given as well as a [facility] note as well. Seems to have chronic diarrhea but also has had multiple bouts of diverticulitis. Surgery, Dr. Cliffton AstersWhite, evaluated on 9/5 and felt that he needed another colonoscopy done here prior to consideration of a surgical resection to evaluate his left colon and thickening that is mentioned in prior notes (last CT read I see was in 2018) - he has documented diverticulitis on multiple CTs, and this is an indication to consider colectomy at times. With that being said, if it is necessary for our surgical colleagues that another colonoscopy is performed - and they have asked for it based on the consultation note (as well as performing random biopsies of the colon and to rule out IBD, then it would be reasonable to proceed with scheduling a colonoscopy for that purpose. In regards to transitioning of his GI care to this office, if that is what he would like to do, that is fine, but as his notes suggest he has had a long and extensive workup for his chronic diarrhea, all of those records will need to be obtained and be available.  He also needs to understand that a single visit, is unlikely to proceed with a final answer to all of his issues, but we would be happy to look at his case and try and be thoughtful about what else could be ongoing but if it recurrent diverticulitis and care for that, he is already seeing the surgery group and after a colonoscopy as they had wanted, they will have to determine the next steps if no evidence of IBD. Hope that helps. I have signed the documents that were left for me as well.

## 2018-01-01 ENCOUNTER — Encounter: Payer: Self-pay | Admitting: Gastroenterology

## 2018-01-10 ENCOUNTER — Other Ambulatory Visit: Payer: Self-pay | Admitting: Orthopedic Surgery

## 2018-01-14 ENCOUNTER — Ambulatory Visit (AMBULATORY_SURGERY_CENTER): Payer: Self-pay

## 2018-01-14 ENCOUNTER — Other Ambulatory Visit: Payer: Self-pay

## 2018-01-14 ENCOUNTER — Encounter: Payer: Self-pay | Admitting: Gastroenterology

## 2018-01-14 VITALS — Ht 72.0 in | Wt 194.0 lb

## 2018-01-14 DIAGNOSIS — K529 Noninfective gastroenteritis and colitis, unspecified: Secondary | ICD-10-CM

## 2018-01-14 DIAGNOSIS — Z1211 Encounter for screening for malignant neoplasm of colon: Secondary | ICD-10-CM

## 2018-01-14 MED ORDER — NA SULFATE-K SULFATE-MG SULF 17.5-3.13-1.6 GM/177ML PO SOLN: 1.0000 | Freq: Once | ORAL | 0 refills | Status: AC

## 2018-01-14 NOTE — Progress Notes (Signed)
No egg or soy allergy known to patient  No issues with past sedation with any surgeries  or procedures, no intubation problems  No diet pills per patient No home 02 use per patient  No blood thinners per patient  Pt denies issues with constipation  No A fib or A flutter  EMMI video sent to pt's e mail  

## 2018-01-16 ENCOUNTER — Ambulatory Visit (HOSPITAL_COMMUNITY): Admit: 2018-01-16 | Payer: BLUE CROSS/BLUE SHIELD | Admitting: Surgery

## 2018-01-16 ENCOUNTER — Encounter (HOSPITAL_COMMUNITY): Payer: Self-pay

## 2018-01-16 SURGERY — COLONOSCOPY
Anesthesia: Monitor Anesthesia Care

## 2018-01-28 ENCOUNTER — Encounter: Payer: Self-pay | Admitting: Gastroenterology

## 2018-01-28 ENCOUNTER — Ambulatory Visit (AMBULATORY_SURGERY_CENTER): Payer: BLUE CROSS/BLUE SHIELD | Admitting: Gastroenterology

## 2018-01-28 VITALS — BP 122/77 | HR 71 | Temp 98.6°F | Resp 13 | Ht 72.0 in | Wt 198.0 lb

## 2018-01-28 DIAGNOSIS — R197 Diarrhea, unspecified: Secondary | ICD-10-CM

## 2018-01-28 DIAGNOSIS — R933 Abnormal findings on diagnostic imaging of other parts of digestive tract: Secondary | ICD-10-CM | POA: Diagnosis not present

## 2018-01-28 DIAGNOSIS — Z1211 Encounter for screening for malignant neoplasm of colon: Secondary | ICD-10-CM

## 2018-01-28 MED ORDER — SODIUM CHLORIDE 0.9 % IV SOLN: 500.0000 mL | INTRAVENOUS | Status: DC

## 2018-01-28 NOTE — Progress Notes (Signed)
Pt. Reports no change in his medical or surgical history since his pre-visit 01/14/2018. 

## 2018-01-28 NOTE — Op Note (Signed)
[facility] Patient Name: Eric Rollins Procedure Date: 01/28/2018 8:37 AM MRN: [id number] Endoscopist: Justice Britain , MD Age: 62 Referring MD:  Date of Birth: 07/14/1955 Gender: Male Account #: [id number] Procedure:                Colonoscopy Indications:              Chronic diarrhea, Personal history of digestive                            disease, Abnormal CT of the GI tract, Preoperative                            assessment, Diverticulosis of the colon,                            Diverticulitis, Follow-up of diverticulitis Medicines:                Monitored Anesthesia Care Procedure:                Pre-Anesthesia Assessment:                           - Prior to the procedure, a History and Physical                            was performed, and patient medications and                            allergies were reviewed. The patient's tolerance of                            previous anesthesia was also reviewed. The risks                            and benefits of the procedure and the sedation                            options and risks were discussed with the patient.                            All questions were answered, and informed consent                            was obtained. Prior Anticoagulants: The patient has                            taken no previous anticoagulant or antiplatelet                            agents. ASA Grade Assessment: II - A patient with                            mild systemic disease. After reviewing the risks  and benefits, the patient was deemed in                            satisfactory condition to undergo the procedure.                           After obtaining informed consent, the colonoscope                            was passed under direct vision. Throughout the                            procedure, the patient's blood pressure, pulse, and                            oxygen saturations were  monitored continuously. The                            Model PCF-H190DL [id number]) scope was introduced                            through the anus and advanced to the 10 cm into the                            ileum. The colonoscopy was performed without                            difficulty. The patient tolerated the procedure.                            The quality of the bowel preparation was evaluated                            using the BBPS [facility] Bowel Preparation Scale)                            with scores of: Right Colon = 3 (entire mucosa seen                            well with no residual staining, small fragments of                            stool or opaque liquid), Transverse Colon = 3                            (entire mucosa seen well with no residual staining,                            small fragments of stool or opaque liquid) and Left                            Colon = 2 (minor amount of residual staining, small  fragments of stool and/or opaque liquid, but mucosa                            seen well). The total BBPS score equals 8. The                            quality of the bowel preparation was good. Scope In: 8:43:18 AM Scope Out: 9:05:39 AM Scope Withdrawal Time: 0 hours 18 minutes 44 seconds  Total Procedure Duration: 0 hours 22 minutes 21 seconds  Findings:                 The digital rectal exam findings include                            non-thrombosed external hemorrhoids. Pertinent                            negatives include no palpable rectal lesions.                           The terminal ileum and ileocecal valve appeared                            normal. Biopsies were taken with a cold forceps for                            histology to rule out enteropathy and IBD.                           Many small and large-mouthed diverticula were found                            in the recto-sigmoid colon, sigmoid colon and                             descending colon (predominance in [place] and RS).                           Segmental areas of mildly erythematous mucosa were                            found in the recto-sigmoid colon and in the sigmoid                            colon on Haustra inbetween diverticulosis. These                            areas were biopsied with a cold forceps for                            histology to rule out Segmental colitis associated                            with diverticulosis.  Normal mucosa was found in the entire colon                            otherwise. Biopsies for histology were taken with a                            cold forceps from the cecum, ascending colon,                            transverse colon, descending colon and rectum for                            evaluation of microscopic colitis.                           Non-bleeding non-thrombosed external and internal                            hemorrhoids were found during retroflexion. The                            hemorrhoids were Grade I (internal hemorrhoids that                            do not prolapse). Complications:            No immediate complications. Estimated Blood Loss:     Estimated blood loss was minimal. Impression:               - Non-thrombosed external hemorrhoids found on                            digital rectal exam.                           - The examined portion of the ileum was normal.                            Biopsied.                           - Diverticulosis in the recto-sigmoid colon, in the                            sigmoid colon and in the descending colon.                           - Erythematous mucosa in the recto-sigmoid colon                            and in the sigmoid colon. Biopsied to rule out SCAD.                           - Normal mucosa in the entire examined colon  otherwise. Biopsied for Microscopic  colitis                            evaluation.                           - Non-bleeding non-thrombosed external and internal                            hemorrhoids. Recommendation:           - The patient will be observed post-procedure,                            until all discharge criteria are met.                           - Discharge patient to home.                           - Patient has a contact number available for                            emergencies. The signs and symptoms of potential                            delayed complications were discussed with the                            patient. Return to normal activities tomorrow.                            Written discharge instructions were provided to the                            patient.                           - High fiber diet.                           - Continue present medications.                           - Await pathology results.                           - Repeat colonoscopy in 10 years for screening                            purposes.                           - Return to Colorectal surgery for next steps in                            evaluation/treatment of recurrent diverticulitis.  In setting, even if SCAD is found, I would believe                            that in setting of recurrent diverticulitis flares,                            that this region would be resected at time of                            colectomy                           - Would consider initiation of Fiber                            supplementation as well in regards to optimizing                            stool bulk and burden.                           - The findings and recommendations were discussed                            with the patient.                           - The findings and recommendations were discussed                            with the patient's family. Justice Britain,  MD 01/28/2018 9:16:00 AM

## 2018-01-28 NOTE — Patient Instructions (Signed)
YOU HAD AN ENDOSCOPIC PROCEDURE TODAY AT THE [facility]:   Refer to the procedure report that was given to you for any specific questions about what was found during the examination.  If the procedure report does not answer your questions, please call your gastroenterologist to clarify.  If you requested that your care partner not be given the details of your procedure findings, then the procedure report has been included in a sealed envelope for you to review at your convenience later.  YOU SHOULD EXPECT: Some feelings of bloating in the abdomen. Passage of more gas than usual.  Walking can help get rid of the air that was put into your GI tract during the procedure and reduce the bloating. If you had a lower endoscopy (such as a colonoscopy or flexible sigmoidoscopy) you may notice spotting of blood in your stool or on the toilet paper. If you underwent a bowel prep for your procedure, you may not have a normal bowel movement for a few days.  Please Note:  You might notice some irritation and congestion in your nose or some drainage.  This is from the oxygen used during your procedure.  There is no need for concern and it should clear up in a day or so.  SYMPTOMS TO REPORT IMMEDIATELY:   Following lower endoscopy (colonoscopy or flexible sigmoidoscopy):  Excessive amounts of blood in the stool  Significant tenderness or worsening of abdominal pains  Swelling of the abdomen that is new, acute  Fever of 100F or higher  For urgent or emergent issues, a gastroenterologist can be reached at any hour by calling [phone].   DIET:  We do recommend a small meal at first, but then you may proceed to your regular diet.  Drink plenty of fluids but you should avoid alcoholic beverages for 24 hours.  ACTIVITY:  You should plan to take it easy for the rest of today and you should NOT DRIVE or use heavy machinery until tomorrow (because of the sedation medicines used during the test).     FOLLOW UP: Our staff will call the number listed on your records the next business day following your procedure to check on you and address any questions or concerns that you may have regarding the information given to you following your procedure. If we do not reach you, we will leave a message.  However, if you are feeling well and you are not experiencing any problems, there is no need to return our call.  We will assume that you have returned to your regular daily activities without incident.  If any biopsies were taken you will be contacted by phone or by letter within the next 1-3 weeks.  Please call us at [phone] if you have not heard about the biopsies in 3 weeks.   Await for biopsy results High Fiber Diet (handout given) Diverticulosis (handout given) Repeat Colonoscopy screening in 10 years    SIGNATURES/CONFIDENTIALITY: You and/or your care partner have signed paperwork which will be entered into your electronic medical record.  These signatures attest to the fact that that the information above on your After Visit Summary has been reviewed and is understood.  Full responsibility of the confidentiality of this discharge information lies with you and/or your care-partner.

## 2018-01-28 NOTE — Progress Notes (Signed)
Spontaneous respirations throughout. VSS. Resting comfortably. To PACU on room air. Report to  RN. 

## 2018-01-28 NOTE — Progress Notes (Signed)
Called to room to assist during endoscopic procedure.  Patient ID and intended procedure confirmed with present staff. Received instructions for my participation in the procedure from the performing physician.  

## 2018-01-29 ENCOUNTER — Encounter (HOSPITAL_BASED_OUTPATIENT_CLINIC_OR_DEPARTMENT_OTHER): Payer: Self-pay | Admitting: *Deleted

## 2018-01-29 ENCOUNTER — Telehealth: Payer: Self-pay

## 2018-01-29 ENCOUNTER — Other Ambulatory Visit: Payer: Self-pay

## 2018-01-29 NOTE — Telephone Encounter (Signed)
No answer, left message to call back later today, B.Ester Hilley RN. 

## 2018-01-29 NOTE — Progress Notes (Signed)
Referral to [facility] has been made  

## 2018-01-29 NOTE — Telephone Encounter (Signed)
  Follow up Call-  Call back number 01/28/2018  Post procedure Call Back phone  # [phone]  Permission to leave phone message Yes  Some recent data might be hidden     Left message

## 2018-02-02 ENCOUNTER — Encounter: Payer: Self-pay | Admitting: Gastroenterology

## 2018-02-03 ENCOUNTER — Encounter (HOSPITAL_BASED_OUTPATIENT_CLINIC_OR_DEPARTMENT_OTHER)
Admission: RE | Admit: 2018-02-03 | Discharge: 2018-02-03 | Disposition: A | Discharge status: Home / Self Care | Payer: BLUE CROSS/BLUE SHIELD | Source: Ambulatory Visit | Attending: Orthopedic Surgery | Admitting: Orthopedic Surgery

## 2018-02-03 DIAGNOSIS — N183 Chronic kidney disease, stage 3 (moderate): Secondary | ICD-10-CM | POA: Diagnosis not present

## 2018-02-03 DIAGNOSIS — K449 Diaphragmatic hernia without obstruction or gangrene: Secondary | ICD-10-CM | POA: Diagnosis not present

## 2018-02-03 DIAGNOSIS — R9431 Abnormal electrocardiogram [ECG] [EKG]: Secondary | ICD-10-CM | POA: Insufficient documentation

## 2018-02-03 DIAGNOSIS — Z87891 Personal history of nicotine dependence: Secondary | ICD-10-CM | POA: Diagnosis not present

## 2018-02-03 DIAGNOSIS — M71341 Other bursal cyst, right hand: Secondary | ICD-10-CM | POA: Diagnosis not present

## 2018-02-03 DIAGNOSIS — R2231 Localized swelling, mass and lump, right upper limb: Secondary | ICD-10-CM | POA: Diagnosis present

## 2018-02-03 DIAGNOSIS — G4730 Sleep apnea, unspecified: Secondary | ICD-10-CM | POA: Diagnosis not present

## 2018-02-03 DIAGNOSIS — Z01818 Encounter for other preprocedural examination: Secondary | ICD-10-CM | POA: Insufficient documentation

## 2018-02-03 DIAGNOSIS — I129 Hypertensive chronic kidney disease with stage 1 through stage 4 chronic kidney disease, or unspecified chronic kidney disease: Secondary | ICD-10-CM | POA: Diagnosis not present

## 2018-02-03 DIAGNOSIS — K219 Gastro-esophageal reflux disease without esophagitis: Secondary | ICD-10-CM | POA: Diagnosis not present

## 2018-02-03 DIAGNOSIS — M479 Spondylosis, unspecified: Secondary | ICD-10-CM | POA: Diagnosis not present

## 2018-02-03 DIAGNOSIS — Z885 Allergy status to narcotic agent status: Secondary | ICD-10-CM | POA: Diagnosis not present

## 2018-02-03 DIAGNOSIS — Z888 Allergy status to other drugs, medicaments and biological substances status: Secondary | ICD-10-CM | POA: Diagnosis not present

## 2018-02-03 LAB — BASIC METABOLIC PANEL
Anion gap: 9 (ref 5–15)
BUN: 7 mg/dL (ref 6–20)
CALCIUM: 8.8 mg/dL — AB (ref 8.9–10.3)
CHLORIDE: 101 mmol/L (ref 98–111)
CO2: 28 mmol/L (ref 22–32)
CREATININE: 1.4 mg/dL — AB (ref 0.61–1.24)
GFR calc non Af Amer: 53 mL/min — ABNORMAL LOW (ref >60)
Glucose, Bld: 164 mg/dL — ABNORMAL HIGH (ref 70–99)
Potassium: 3.7 mmol/L (ref 3.5–5.1)
SODIUM: 138 mmol/L (ref 135–145)

## 2018-02-04 ENCOUNTER — Other Ambulatory Visit: Payer: Self-pay

## 2018-02-04 ENCOUNTER — Ambulatory Visit (HOSPITAL_BASED_OUTPATIENT_CLINIC_OR_DEPARTMENT_OTHER): Payer: BLUE CROSS/BLUE SHIELD | Admitting: Certified Registered"

## 2018-02-04 ENCOUNTER — Encounter (HOSPITAL_BASED_OUTPATIENT_CLINIC_OR_DEPARTMENT_OTHER)
Admission: RE | Disposition: A | Discharge status: Home / Self Care | Payer: Self-pay | Source: Ambulatory Visit | Attending: Orthopedic Surgery

## 2018-02-04 ENCOUNTER — Ambulatory Visit (HOSPITAL_BASED_OUTPATIENT_CLINIC_OR_DEPARTMENT_OTHER)
Admission: RE | Admit: 2018-02-04 | Discharge: 2018-02-04 | Disposition: A | Discharge status: Home / Self Care | Payer: BLUE CROSS/BLUE SHIELD | Source: Ambulatory Visit | Attending: Orthopedic Surgery | Admitting: Orthopedic Surgery

## 2018-02-04 ENCOUNTER — Encounter (HOSPITAL_BASED_OUTPATIENT_CLINIC_OR_DEPARTMENT_OTHER): Payer: Self-pay | Admitting: *Deleted

## 2018-02-04 DIAGNOSIS — G4730 Sleep apnea, unspecified: Secondary | ICD-10-CM | POA: Insufficient documentation

## 2018-02-04 DIAGNOSIS — I129 Hypertensive chronic kidney disease with stage 1 through stage 4 chronic kidney disease, or unspecified chronic kidney disease: Secondary | ICD-10-CM | POA: Insufficient documentation

## 2018-02-04 DIAGNOSIS — M71341 Other bursal cyst, right hand: Secondary | ICD-10-CM | POA: Insufficient documentation

## 2018-02-04 DIAGNOSIS — M479 Spondylosis, unspecified: Secondary | ICD-10-CM | POA: Insufficient documentation

## 2018-02-04 DIAGNOSIS — K219 Gastro-esophageal reflux disease without esophagitis: Secondary | ICD-10-CM | POA: Diagnosis not present

## 2018-02-04 DIAGNOSIS — Z885 Allergy status to narcotic agent status: Secondary | ICD-10-CM | POA: Insufficient documentation

## 2018-02-04 DIAGNOSIS — N183 Chronic kidney disease, stage 3 (moderate): Secondary | ICD-10-CM | POA: Diagnosis not present

## 2018-02-04 DIAGNOSIS — Z888 Allergy status to other drugs, medicaments and biological substances status: Secondary | ICD-10-CM | POA: Insufficient documentation

## 2018-02-04 DIAGNOSIS — K449 Diaphragmatic hernia without obstruction or gangrene: Secondary | ICD-10-CM | POA: Insufficient documentation

## 2018-02-04 DIAGNOSIS — Z87891 Personal history of nicotine dependence: Secondary | ICD-10-CM | POA: Insufficient documentation

## 2018-02-04 HISTORY — PX: MASS EXCISION: SHX2000

## 2018-02-04 SURGERY — EXCISION MASS
Anesthesia: Monitor Anesthesia Care | Site: Hand | Laterality: Right

## 2018-02-04 MED ORDER — BUPIVACAINE HCL (PF) 0.25 % IJ SOLN
INTRAMUSCULAR | Status: DC | PRN
  Administered 2018-02-04: 6 mL

## 2018-02-04 MED ORDER — MIDAZOLAM HCL 2 MG/2ML IJ SOLN
INTRAMUSCULAR | Status: AC
  Filled 2018-02-04: qty 2

## 2018-02-04 MED ORDER — TRAMADOL HCL 50 MG PO TABS: 50.0000 mg | ORAL_TABLET | Freq: Four times a day (QID) | ORAL | 0 refills | Status: DC | PRN

## 2018-02-04 MED ORDER — LACTATED RINGERS IV SOLN
INTRAVENOUS | Status: DC
  Administered 2018-02-04: 09:00:00 via INTRAVENOUS

## 2018-02-04 MED ORDER — FENTANYL CITRATE (PF) 100 MCG/2ML IJ SOLN
25.0000 ug | INTRAMUSCULAR | Status: DC | PRN
  Administered 2018-02-04: 50 ug via INTRAVENOUS

## 2018-02-04 MED ORDER — FENTANYL CITRATE (PF) 100 MCG/2ML IJ SOLN
INTRAMUSCULAR | Status: AC
  Filled 2018-02-04: qty 2

## 2018-02-04 MED ORDER — ONDANSETRON HCL 4 MG/2ML IJ SOLN
INTRAMUSCULAR | Status: DC | PRN
  Administered 2018-02-04: 4 mg via INTRAVENOUS

## 2018-02-04 MED ORDER — VANCOMYCIN HCL IN DEXTROSE 1-5 GM/200ML-% IV SOLN
INTRAVENOUS | Status: AC
  Filled 2018-02-04: qty 200

## 2018-02-04 MED ORDER — SCOPOLAMINE 1 MG/3DAYS TD PT72: 1.0000 | MEDICATED_PATCH | Freq: Once | TRANSDERMAL | Status: DC | PRN

## 2018-02-04 MED ORDER — ONDANSETRON HCL 4 MG/2ML IJ SOLN: 4.0000 mg | Freq: Once | INTRAMUSCULAR | Status: DC | PRN

## 2018-02-04 MED ORDER — VANCOMYCIN HCL IN DEXTROSE 1-5 GM/200ML-% IV SOLN
1000.0000 mg | INTRAVENOUS | Status: AC
  Administered 2018-02-04: 1000 mg via INTRAVENOUS

## 2018-02-04 MED ORDER — CHLORHEXIDINE GLUCONATE 4 % EX LIQD: 60.0000 mL | Freq: Once | CUTANEOUS | Status: DC

## 2018-02-04 MED ORDER — MIDAZOLAM HCL 2 MG/2ML IJ SOLN
1.0000 mg | INTRAMUSCULAR | Status: DC | PRN
  Administered 2018-02-04: 2 mg via INTRAVENOUS

## 2018-02-04 MED ORDER — PROPOFOL 500 MG/50ML IV EMUL
INTRAVENOUS | Status: DC | PRN
  Administered 2018-02-04: 100 ug/kg/min via INTRAVENOUS

## 2018-02-04 MED ORDER — LIDOCAINE HCL (CARDIAC) PF 100 MG/5ML IV SOSY
PREFILLED_SYRINGE | INTRAVENOUS | Status: DC | PRN
  Administered 2018-02-04: 30 mg via INTRAVENOUS

## 2018-02-04 MED ORDER — FENTANYL CITRATE (PF) 100 MCG/2ML IJ SOLN
50.0000 ug | INTRAMUSCULAR | Status: DC | PRN
  Administered 2018-02-04: 100 ug via INTRAVENOUS

## 2018-02-04 SURGICAL SUPPLY — 47 items
BANDAGE COBAN STERILE 2 (GAUZE/BANDAGES/DRESSINGS) IMPLANT
BLADE SURG 15 STRL LF DISP TIS (BLADE) ×1 IMPLANT
BLADE SURG 15 STRL SS (BLADE) ×2
BNDG COHESIVE 1X5 TAN STRL LF (GAUZE/BANDAGES/DRESSINGS) ×3 IMPLANT
BNDG COHESIVE 3X5 TAN STRL LF (GAUZE/BANDAGES/DRESSINGS) IMPLANT
BNDG ESMARK 4X9 LF (GAUZE/BANDAGES/DRESSINGS) IMPLANT
BNDG GAUZE ELAST 4 BULKY (GAUZE/BANDAGES/DRESSINGS) IMPLANT
CHLORAPREP W/TINT 26ML (MISCELLANEOUS) ×3 IMPLANT
CORD BIPOLAR FORCEPS 12FT (ELECTRODE) ×3 IMPLANT
COVER BACK TABLE 60X90IN (DRAPES) ×3 IMPLANT
COVER MAYO STAND STRL (DRAPES) ×3 IMPLANT
COVER WAND RF STERILE (DRAPES) IMPLANT
CUFF TOURNIQUET SINGLE 18IN (TOURNIQUET CUFF) ×3 IMPLANT
DECANTER SPIKE VIAL GLASS SM (MISCELLANEOUS) IMPLANT
DRAIN PENROSE 1/2X12 LTX STRL (WOUND CARE) IMPLANT
DRAPE EXTREMITY T 121X128X90 (DRAPE) ×3 IMPLANT
DRAPE SURG 17X23 STRL (DRAPES) ×3 IMPLANT
GAUZE SPONGE 4X4 12PLY STRL (GAUZE/BANDAGES/DRESSINGS) ×3 IMPLANT
GAUZE XEROFORM 1X8 LF (GAUZE/BANDAGES/DRESSINGS) ×3 IMPLANT
GLOVE BIOGEL PI IND STRL 6.5 (GLOVE) ×1 IMPLANT
GLOVE BIOGEL PI IND STRL 8 (GLOVE) ×1 IMPLANT
GLOVE BIOGEL PI IND STRL 8.5 (GLOVE) ×1 IMPLANT
GLOVE BIOGEL PI INDICATOR 6.5 (GLOVE) ×2
GLOVE BIOGEL PI INDICATOR 8 (GLOVE) ×2
GLOVE BIOGEL PI INDICATOR 8.5 (GLOVE) ×2
GLOVE SURG ORTHO 8.0 STRL STRW (GLOVE) ×3 IMPLANT
GLOVE SURG SYN 8.0 (GLOVE) ×3 IMPLANT
GOWN STRL REIN XL XLG (GOWN DISPOSABLE) ×3 IMPLANT
GOWN STRL REUS W/ TWL LRG LVL3 (GOWN DISPOSABLE) IMPLANT
GOWN STRL REUS W/TWL LRG LVL3 (GOWN DISPOSABLE)
GOWN STRL REUS W/TWL XL LVL3 (GOWN DISPOSABLE) ×3 IMPLANT
NDL SAFETY ECLIPSE 18X1.5 (NEEDLE) IMPLANT
NEEDLE HYPO 18GX1.5 SHARP (NEEDLE)
NEEDLE PRECISIONGLIDE 27X1.5 (NEEDLE) ×3 IMPLANT
NS IRRIG 1000ML POUR BTL (IV SOLUTION) ×3 IMPLANT
PACK BASIN DAY SURGERY FS (CUSTOM PROCEDURE TRAY) ×3 IMPLANT
PAD CAST 3X4 CTTN HI CHSV (CAST SUPPLIES) IMPLANT
PADDING CAST COTTON 3X4 STRL (CAST SUPPLIES)
SPLINT PLASTER CAST XFAST 3X15 (CAST SUPPLIES) IMPLANT
SPLINT PLASTER XTRA FASTSET 3X (CAST SUPPLIES)
STOCKINETTE 4X48 STRL (DRAPES) ×3 IMPLANT
SUT ETHILON 4 0 PS 2 18 (SUTURE) ×3 IMPLANT
SUT VIC AB 4-0 P2 18 (SUTURE) IMPLANT
SYR BULB 3OZ (MISCELLANEOUS) ×3 IMPLANT
SYR CONTROL 10ML LL (SYRINGE) ×3 IMPLANT
TOWEL GREEN STERILE FF (TOWEL DISPOSABLE) ×3 IMPLANT
UNDERPAD 30X30 (UNDERPADS AND DIAPERS) ×3 IMPLANT

## 2018-02-04 NOTE — Transfer of Care (Signed)
Immediate Anesthesia Transfer of Care Note  Patient: Eric Rollins  Procedure(s) Performed: EXCISION MASS RIGHT RING FINGER (Right Hand)  Patient Location: PACU  Anesthesia Type:Bier block  Level of Consciousness: awake, alert , oriented and patient cooperative  Airway & Oxygen Therapy: Patient Spontanous Breathing and Patient connected to face mask oxygen  Post-op Assessment: Report given to RN and Post -op Vital signs reviewed and stable  Post vital signs: Reviewed and stable  Last Vitals:  Vitals Value Taken Time  BP    Temp    Pulse 76 02/04/2018 10:03 AM  Resp 12 02/04/2018 10:03 AM  SpO2 98 % 02/04/2018 10:03 AM  Vitals shown include unvalidated device data.  Last Pain:  Vitals:   02/04/18 0816  TempSrc:   PainSc: 0-No pain         Complications: No apparent anesthesia complications

## 2018-02-04 NOTE — Anesthesia Procedure Notes (Signed)
Procedure Name: MAC Date/Time: 02/04/2018 9:55 AM Performed by: Signe Colt, CRNA Pre-anesthesia Checklist: Patient identified, Emergency Drugs available, Suction available, Patient being monitored and Timeout performed Patient Re-evaluated:Patient Re-evaluated prior to induction Oxygen Delivery Method: Simple face mask

## 2018-02-04 NOTE — H&P (Signed)
Eric Rollins is an 62 y.o. male.   Chief Complaint: mass HPI: Eric Rollins is a 62 year old ambidextrous male initially left-handed but sustained an injury to his thumb index middle finger at the age of 67 when a blasting Remove the tip of his thumb the distal aspect of the index and middle fingers from the proximal phalanx distally. He became right-handed. He is complaining of a mass in his right ring finger referred by Dr. Dema Severin. This been going on for approximately 1 year. The mass on the volar ulnar aspect just distal to the webspace. It is a dull aching pain with a VAS score to 3-4 over 10. If it becomes inflamed it becomes 8 or 9/10. If he hits the area he has a shooting pain distally. With a feeling of numbness. He recalls no history of injury. He has a history of arthritis no history of diabetes arthritis or gout. Family history is negative for each of these. He has had no treatment nor tried anything for it.    Past Medical History:  Diagnosis Date  . Arthritis    in back  . Chronic kidney disease    Stage III - followed by nephrologist q95month- [facility]  . Claustrophobia    "severe Claustrophobia" "problem with closed in rooms"  . Diverticulitis   . GERD (gastroesophageal reflux disease)   . H/O hiatal hernia   . Hypertension   . [facility]    [facility]  . Panic attacks   . Sleep apnea    uses "the newest type of machine " not cpap or bipap. 01-06-16 No longer using- not in past year  . Transfusion history    past history-not recent    Past Surgical History:  Procedure Laterality Date  . APPENDECTOMY    . COLONOSCOPY    . ESOPHAGOGASTRODUODENOSCOPY N/A 09/15/2013   Procedure: ESOPHAGOGASTRODUODENOSCOPY (EGD);  Surgeon: DShann Medal MD;  [facility];  Service: General;  Laterality: N/A;  . LAPAROSCOPIC NISSEN FUNDOPLICATION N/A 82/40/9735  Procedure: REDO LAPAROSCOPIC NISSEN WITH UPPER ENDOSCOPY;  Surgeon: MPedro Earls MD;  Location: [facility];   Service: General;  Laterality: N/A;  . NISSEN FUNDOPLICATION    . 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 UPPER GI ENDOSCOPY  01/13/2016   Procedure: UPPER GI ENDOSCOPY;  Surgeon: MJohnathan Hausen MD;  Location: [facility];  Service: General;;    Family History  Problem Relation Age of Onset  . Colon cancer Neg Hx   . Esophageal cancer Neg Hx   . Rectal cancer Neg Hx   . Stomach cancer Neg Hx    Social History:  reports that he quit smoking about 27 years ago. His smoking use included cigarettes. He has never used smokeless tobacco. He reports that he does not drink alcohol or use drugs.  Allergies:  Allergies  Allergen Reactions  . Lisinopril     cough  . Oxycodone     Rash Pt has tolerated Percocet in past per RN  . Promethazine   . Penicillins Rash    No medications prior to admission.    Results for orders placed or performed during the hospital encounter of 02/04/18 (from the past 48 hour(s))  Basic metabolic panel     Status: Abnormal   Collection Time: 02/03/18 12:30 PM  Result Value Ref Range   Sodium 138 135 - 145 mmol/L   Potassium 3.7 3.5 - 5.1 mmol/L   Chloride 101 98 - 111 mmol/L  CO2 28 22 - 32 mmol/L   Glucose, Bld 164 (H) 70 - 99 mg/dL   BUN 7 6 - 20 mg/dL   Creatinine, Ser 1.40 (H) 0.61 - 1.24 mg/dL   Calcium 8.8 (L) 8.9 - 10.3 mg/dL   GFR calc non Af Amer 53 (L) >60 mL/min   GFR calc Af Amer >60 >60 mL/min    Comment: (NOTE) The eGFR has been calculated using the CKD EPI equation. This calculation has not been validated in all clinical situations. eGFR's persistently <60 mL/min signify possible Chronic Kidney Disease.    Anion gap 9 5 - 15    Comment: Performed at [facility] [address], [place], [place] [address]    No results found.   Pertinent items are noted in HPI.  Height 6' (1.829 m), weight 89.8 kg.  General appearance: alert, cooperative and appears stated age Head: Normocephalic, without obvious abnormality Neck: no  JVD Resp: clear to auscultation bilaterally Cardio: regular rate and rhythm, S1, S2 normal, no murmur, click, rub or gallop GI: soft, non-tender; bowel sounds normal; no masses,  no organomegaly Extremities: mass right ring finger Pulses: 2+ and symmetric Skin: Skin color, texture, turgor normal. No rashes or lesions Neurologic: Grossly normal Incision/Wound: na  Assessment:   Mass    Plan: He would like to have this removed from his right ring finger. Pre-peri-and postoperative course are discussed along with risks and complications. He is aware there is no guarantee to the surgery the possibility of infection recurrence injury to arteries nerves tendons complete relief symptoms dystrophy. Advised by the potential for recurrence of either the 2 problems. He does have a small foreign body on his x-ray of his index finger. Would not recommend removal of that.     Daryll Brod 02/04/2018, 5:39 AM

## 2018-02-04 NOTE — Anesthesia Preprocedure Evaluation (Signed)
Anesthesia Evaluation  Patient identified by MRN, date of birth, ID band Patient awake    Reviewed: Allergy & Precautions, NPO status , Patient's Chart, lab work & pertinent test results  History of Anesthesia Complications Negative for: history of anesthetic complications  Airway Mallampati: II  TM Distance: >3 FB Neck ROM: Full    Dental  (+) Dental Advisory Given, Missing   Pulmonary neg pulmonary ROS, sleep apnea , former smoker,    Pulmonary exam normal breath sounds clear to auscultation       Cardiovascular hypertension, Pt. on medications and Pt. on home beta blockers Normal cardiovascular exam Rhythm:Regular Rate:Normal     Neuro/Psych PSYCHIATRIC DISORDERS Anxiety negative neurological ROS     GI/Hepatic Neg liver ROS, hiatal hernia, GERD  Controlled and Medicated,  Endo/Other  negative endocrine ROS  Renal/GU Renal InsufficiencyRenal disease     Musculoskeletal  (+) Arthritis ,   Abdominal   Peds  Hematology negative hematology ROS (+)   Anesthesia Other Findings Day of surgery medications reviewed with the patient.  Reproductive/Obstetrics                             Anesthesia Physical Anesthesia Plan  ASA: II  Anesthesia Plan: MAC and Bier Block and Bier Block-LIDOCAINE ONLY   Post-op Pain Management:    Induction: Intravenous  PONV Risk Score and Plan: 1 and Propofol infusion and Treatment may vary due to age or medical condition  Airway Management Planned: Nasal Cannula and Natural Airway  Additional Equipment:   Intra-op Plan:   Post-operative Plan:   Informed Consent: I have reviewed the patients History and Physical, chart, labs and discussed the procedure including the risks, benefits and alternatives for the proposed anesthesia with the patient or authorized representative who has indicated his/her understanding and acceptance.   Dental advisory  given  Plan Discussed with:   Anesthesia Plan Comments:         Anesthesia Quick Evaluation

## 2018-02-04 NOTE — Brief Op Note (Signed)
02/04/2018  10:00 AM  PATIENT:  Eric Rollins  62 y.o. male  PRE-OPERATIVE DIAGNOSIS:  MASS RIGHT RING FINGER  POST-OPERATIVE DIAGNOSIS:  MASS RIGHT RING FINGER  PROCEDURE:  Procedure(s): EXCISION MASS RIGHT RING FINGER (Right)  SURGEON:  Surgeon(s) and Role:    * Eric Salt, MD - Primary  PHYSICIAN ASSISTANT:   ASSISTANTS: none   ANESTHESIA:   local, regional and IV sedation  EBL: 1ml BLOOD ADMINISTERED:none  DRAINS: none   LOCAL MEDICATIONS USED:  BUPIVICAINE   SPECIMEN:  Excision  DISPOSITION OF SPECIMEN:  PATHOLOGY  COUNTS:  YES  TOURNIQUET:   Total Tourniquet Time Documented: Forearm (Right) - 19 minutes Total: Forearm (Right) - 19 minutes   DICTATION: .Eric Rollins Dictation  PLAN OF CARE: Discharge to home after PACU  PATIENT DISPOSITION:  PACU - hemodynamically stable.

## 2018-02-04 NOTE — Discharge Instructions (Signed)

## 2018-02-04 NOTE — Anesthesia Postprocedure Evaluation (Signed)
Anesthesia Post Note  Patient: Nurse, adult  Procedure(s) Performed: EXCISION MASS RIGHT RING FINGER (Right Hand)     Patient location during evaluation: PACU Anesthesia Type: MAC and Bier Block Level of consciousness: awake and alert, awake and oriented Pain management: pain level controlled Vital Signs Assessment: post-procedure vital signs reviewed and stable Respiratory status: spontaneous breathing, nonlabored ventilation and respiratory function stable Cardiovascular status: stable and blood pressure returned to baseline Postop Assessment: no apparent nausea or vomiting Anesthetic complications: no    Last Vitals:  Vitals:   02/04/18 1030 02/04/18 1052  BP: 122/81 (!) 145/90  Pulse: 72 70  Resp: 15 16  Temp:  36.7 C  SpO2: 96% 98%    Last Pain:  Vitals:   02/04/18 1052  TempSrc:   PainSc: 1                  Catalina Gravel

## 2018-02-04 NOTE — Anesthesia Procedure Notes (Signed)
Anesthesia Regional Block: Bier block (IV Regional)   Pre-Anesthetic Checklist: ,, timeout performed, Correct Patient, Correct Site, Correct Laterality, Correct Procedure,, site marked, surgical consent,, at surgeon's request  Laterality: Right     Needles:  Injection technique: Single-shot  Needle Type: Other      Needle Gauge: 22     Additional Needles:   Procedures:,,,,, intact distal pulses, Esmarch exsanguination, single tourniquet utilized,  Narrative:   Performed by: Personally       

## 2018-02-04 NOTE — Op Note (Signed)
NAME: Eric Rollins MEDICAL RECORD NO: [id number] DATE OF BIRTH: Feb 17, 1956 FACILITY: [facility] LOCATION: [facility] PHYSICIAN: Nicki Reaper, MD   OPERATIVE REPORT   DATE OF PROCEDURE: 02/04/18    PREOPERATIVE DIAGNOSIS:   Flexor sheath cyst right ring finger   POSTOPERATIVE DIAGNOSIS:   Same   PROCEDURE:   Excision flexor sheath cyst right ring finger   SURGEON: Cindee Salt, M.D.   ASSISTANT: none   ANESTHESIA:  Bier block with sedation and Local   INTRAVENOUS FLUIDS:  Per anesthesia flow sheet.   ESTIMATED BLOOD LOSS:  Minimal.   COMPLICATIONS:  None.   SPECIMENS:   Cyst   TOURNIQUET TIME:    Total Tourniquet Time Documented: Forearm (Right) - 19 minutes Total: Forearm (Right) - 19 minutes    DISPOSITION:  Stable to PACU.   INDICATIONS: Is a 62 year old male with a mass on his right ring finger ulnar volar aspect at the metacarpal phalangeal joint crease is desirous having this removed.  Would not recommend injection due to its proximity to the neurovascular bundle without being able to determine where the neurovascular bundle is in relation to the mass.  He has elected to undergo surgical excision.  Pre-peri-postoperative course been discussed along with risk complications.  He is aware there is no guarantee to the surgery the possibility of infection recurrence injury to arteries nerves tendons complete relief of symptoms and dystrophy the possibility of recurrence of the cyst.  Preoperative area the patient is seen extremity marked by both patient and surgeon antibiotic given  OPERATIVE COURSE: Patient is brought to the operating room where a forearm IV regional anesthetic was carried out without difficulty under the direction the anesthesia department.  Was prepped using ChloraPrep in the supine position with the right arm free.  A three-minute dry time was allowed timeout taken to confirm patient procedure.  A Bruner incision was made to apex over the  mass at the metacarpal phalangeal joint crease.  This carried down through the subcutaneous tissue.  Bleeders were electrocauterized with bipolar as necessary.  The neurovascular bundle was found to be to the ulnar aspect of the mass and thick.  The mass measured approximately a centimeter and a half in diameter.  Blunt sharp dissection was dissected free and followed down into the flexor sheath was which was opened allowing excision of the mass in total.  This was sent to pathology.  The area of the opening in the flexor sheath was then further opened.  Injury to the flexor tendons was noted beneath the opening.  It was copiously irrigated with saline.  The skin was closed interrupted 4 nylon sutures.  A local infiltration quarter percent bupivacaine without epinephrine was given approximately 6 cc was used.  A sterile compressive dressing to the finger was applied.  Deflation of the tourniquet all fingers immediately pink.  He was taken to the recovery room for observation in satisfactory condition.  He will be discharged home to return to [facility] on 1 week on Ultr[facility].   Cindee Salt, MD Electronically signed, 02/04/18

## 2018-02-05 ENCOUNTER — Encounter (HOSPITAL_BASED_OUTPATIENT_CLINIC_OR_DEPARTMENT_OTHER): Payer: Self-pay | Admitting: Orthopedic Surgery

## 2018-02-12 ENCOUNTER — Encounter: Payer: Self-pay | Admitting: Gastroenterology

## 2018-02-12 ENCOUNTER — Other Ambulatory Visit (INDEPENDENT_AMBULATORY_CARE_PROVIDER_SITE_OTHER): Payer: BLUE CROSS/BLUE SHIELD

## 2018-02-12 ENCOUNTER — Ambulatory Visit (INDEPENDENT_AMBULATORY_CARE_PROVIDER_SITE_OTHER): Payer: BLUE CROSS/BLUE SHIELD | Admitting: Gastroenterology

## 2018-02-12 VITALS — BP 110/70 | HR 84 | Ht 72.0 in | Wt 191.0 lb

## 2018-02-12 DIAGNOSIS — K219 Gastro-esophageal reflux disease without esophagitis: Secondary | ICD-10-CM

## 2018-02-12 DIAGNOSIS — K529 Noninfective gastroenteritis and colitis, unspecified: Secondary | ICD-10-CM

## 2018-02-12 DIAGNOSIS — R1319 Other dysphagia: Secondary | ICD-10-CM

## 2018-02-12 DIAGNOSIS — Z8719 Personal history of other diseases of the digestive system: Secondary | ICD-10-CM

## 2018-02-12 DIAGNOSIS — Z9889 Other specified postprocedural states: Secondary | ICD-10-CM | POA: Diagnosis not present

## 2018-02-12 DIAGNOSIS — R935 Abnormal findings on diagnostic imaging of other abdominal regions, including retroperitoneum: Secondary | ICD-10-CM

## 2018-02-12 DIAGNOSIS — R1310 Dysphagia, unspecified: Secondary | ICD-10-CM

## 2018-02-12 LAB — B12 AND FOLATE PANEL
FOLATE: 8.9 ng/mL (ref >5.9)
VITAMIN B 12: 877 pg/mL (ref 211–911)

## 2018-02-12 LAB — IGA: IGA: 248 mg/dL (ref 68–378)

## 2018-02-12 LAB — TSH: TSH: 1.28 u[IU]/mL (ref 0.35–4.50)

## 2018-02-12 NOTE — Progress Notes (Signed)
[facility] VISIT   Primary Care Provider Nicola Girt, [address] [place] [address] [phone]  Referring Provider Nicola Girt, [address] [place], [place] [address] [phone]  Patient Profile: Eric Rollins is a 62 y.o. male with a pmh significant for CRI, OA, Recurrent Diverticulitis, HTN, GERD, Hiatal Hernia s/p Repair and Nissen with 2 redo surgeries, OSA, Chronic Diarrhea NOS.  The patient presents to the [facility] for an evaluation and management of problem(s) noted below:  Problem List 1. Chronic diarrhea   2. History of diverticulitis   3. Recurrent Hiatal hernia s/p redo Nissen and hiatal hernia closure in 2015 and then redo in 2017   4. Gastroesophageal reflux disease, esophagitis presence not specified   5. Esophageal dysphagia   6. Abnormal CT of the abdomen     History of Present Illness: This is the patient's first visit to the outpatient [facility] clinic.  I met him for a direct to procedure Colonoscopy earlier this month for evaluation of recurrent diverticulitis episodes over the last 2-years prior to planning a left-colon resection on behalf of Dr. Dema Severin of [facility].  At time of his colonoscopy I found diverticulosis of the left colon.  I also intubated the TI and took biopsies which were normal.  I took random colon biopsies that were norma and negative for Microscopic colitis.  I took biopsies of some areas of erythema between diverticular folds and that also came back negative without any evidence of inflammation making SCAD unlikely.  He followed up with Dr. Dema Severin, and although they want to do a resection, the patient described to him a much longer history of diarrhea and there is hesitation of wanting to pursue a surgery without a clear diagnosis, so he is sent for further workup/management before surgery.  I have no prior GI records to review unfortunately as  they were done outside of the [facility].  With this being the said, the patient describes at least 4-5 years worth of diarrhea.  He has anywhere between 3-6 bowel movements daily usually 4-5.  These are liquid mostly.  He had not been diagnosed with recurrent diverticulitis until 2-years ago so the diarrhea per his report preceded his attacks more recently.  The patient describes a prior workup that included colonoscopies but did not know if biopsies were performed.  He does not remember any stool cultures.  He says that he had a positive Breath test for bacteria overgrowth (told that it was in stomach and his small intestine) and was treated with antibiotics but did not have improvement and never had eradication confirmed.  He has not noted any blood with his bowel movements.  He has no family members that he knows of that have had any diarrheal illness or GI issues.  He is not sure if he has been tested for pancreatic insufficiency.  He is not sure if he has been tested for Celiac disease.  He had a prior EGD but years ago, in the setting of his GERD and findings of a Hiatal hernia that required multiple re-dos.  GI Review of Systems Positive as above pyrosis, single episode of solid-food dysphagia a few weeks ago but none since Negative for globus, nausea, vomiting, abdominal pain  Review of Systems General: Positive for weight loss in last few months with most recent episode of diverticulitis and persistence; denies fevers/chills currently HEENT: Denies oral lesions Cardiovascular: Denies chest pain/palpitations Pulmonary:  Denies shortness of breath/cough Gastroenterological: See HPI Genitourinary: Denies darkened urine Hematological: Denies easy bruising/bleeding Endocrine: Denies temperature intolerance Dermatological: Denies jaundice Psychological: Mood is anxious to get his surgery soon Musculoskeletal: Denies new arthralgias   Medications Current Outpatient Medications  Medication  Sig Dispense Refill  . amLODipine (NORVASC) 10 MG tablet Take 10 mg by mouth daily.    . Artificial Saliva (BIOTENE ORALBALANCE DRY MOUTH) LIQD Use as directed in the mouth or throat See admin instructions. He uses one spray at bedtime.    . cholecalciferol (VITAMIN D) 1000 units tablet Take 1,000 Units by mouth daily.    . diazepam (VALIUM) 5 MG tablet Take 5 mg by mouth every 6 (six) hours as needed for anxiety.    . diphenoxylate-atropine (LOMOTIL) 2.5-0.025 MG tablet TAKE 2 TABLETS BY MOUTH IN THE MORNING AND 1 AFTER EACH LOOSE STOOL (NOT MORE THAN 6 TABS PER DAY)    . esomeprazole (NEXIUM) 40 MG capsule Take by mouth.    Marland Kitchen HYDROcodone-acetaminophen (NORCO/VICODIN) 5-325 MG tablet Take 1 tablet by mouth every 6 (six) hours as needed for moderate pain.    . metoprolol succinate (TOPROL-XL) 100 MG 24 hr tablet Take 100 mg by mouth every morning.     . sildenafil (REVATIO) 20 MG tablet TAKE 3 TO 5 TABLETS BY MOUTH 30 MINUTES TO 1 HOUR PRIOR TO INTERCOURSE AS DIRECTED    . tiZANidine (ZANAFLEX) 4 MG tablet Take 4 mg by mouth every 6 (six) hours as needed for muscle spasms.    . traMADol (ULTRAM) 50 MG tablet Take by mouth every 6 (six) hours as needed.    . triamterene-hydrochlorothiazide (MAXZIDE) 75-50 MG tablet TAKE 1/2 TABLET BY MOUTH EVERY DAY    . vitamin B-12 (CYANOCOBALAMIN) 1000 MCG tablet Take 1,000 mcg by mouth daily.     No current facility-administered medications for this visit.     Allergies Allergies  Allergen Reactions  . Lisinopril     cough  . Oxycodone     Rash Pt has tolerated Percocet in past per RN  . Promethazine   . Penicillins Rash    Histories Past Medical History:  Diagnosis Date  . Arthritis    in back  . Chronic kidney disease    Stage III - followed by nephrologist q44month- [facility]  . Claustrophobia    "severe Claustrophobia" "problem with closed in rooms"  . Diverticulitis   . GERD (gastroesophageal reflux disease)   . H/O hiatal hernia     . Hypertension   . Palpitations    "[facility]  . Panic attacks   . Sleep apnea    uses "the newest type of machine " not cpap or bipap. 01-06-16 No longer using- not in past year  . Transfusion history    past history-not recent   Past Surgical History:  Procedure Laterality Date  . APPENDECTOMY    . COLONOSCOPY    . ESOPHAGOGASTRODUODENOSCOPY N/A 09/15/2013   Procedure: ESOPHAGOGASTRODUODENOSCOPY (EGD);  Surgeon: DShann Medal MD;  [facility];  Service: General;  Laterality: N/A;  . LAPAROSCOPIC NISSEN FUNDOPLICATION N/A 85/36/4680  Procedure: REDO LAPAROSCOPIC NISSEN WITH UPPER ENDOSCOPY;  Surgeon: MPedro Earls MD;  Location: [facility];  Service: General;  Laterality: N/A;  . MASS EXCISION Right 02/04/2018   Procedure: EXCISION MASS RIGHT RING FINGER;  Surgeon: KDaryll Brod MD;  [facility]  Service: Orthopedics;  Laterality: Right;  . NISSEN FUNDOPLICATION    . 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 UPPER GI ENDOSCOPY  01/13/2016   Procedure: UPPER GI ENDOSCOPY;  Surgeon: Johnathan Hausen, MD;  Location: [facility];  Service: General;;   Social History   Socioeconomic History  . Marital status: Married    Spouse name: Not on file  . Number of children: Not on file  . Years of education: Not on file  . Highest education level: Not on file  Occupational History  . Not on file  Social Needs  . Financial resource strain: Not on file  . Food insecurity:    Worry: Not on file    Inability: Not on file  . Transportation needs:    Medical: Not on file    Non-medical: Not on file  Tobacco Use  . Smoking status: Former Smoker    Types: Cigarettes    Last attempt to quit: 08/27/1990    Years since quitting: 27.4  . Smokeless tobacco: Never Used  Substance and Sexual Activity  . Alcohol use: No  . Drug use: No  . Sexual activity: Not on file  Lifestyle  . Physical activity:    Days per week: Not on file    Minutes per  session: Not on file  . Stress: Not on file  Relationships  . Social connections:    Talks on phone: Not on file    Gets together: Not on file    Attends religious service: Not on file    Active member of club or organization: Not on file    Attends meetings of clubs or organizations: Not on file    Relationship status: Not on file  . Intimate partner violence:    Fear of current or ex partner: Not on file    Emotionally abused: Not on file    Physically abused: Not on file    Forced sexual activity: Not on file  Other Topics Concern  . Not on file  Social History Narrative  . Not on file   Family History  Problem Relation Age of Onset  . Colon cancer Neg Hx   . Esophageal cancer Neg Hx   . Rectal cancer Neg Hx   . Stomach cancer Neg Hx   . Inflammatory bowel disease Neg Hx   . Pancreatic cancer Neg Hx    I have reviewed his medical, social, and family history in detail and updated the electronic medical record as necessary.    PHYSICAL EXAMINATION  BP 110/70   Pulse 84   Ht 6' (1.829 m)   Wt 191 lb (86.6 kg)   BMI 25.90 kg/m  Wt Readings from Last 3 Encounters:  02/12/18 191 lb (86.6 kg)  02/04/18 192 lb 14.4 oz (87.5 kg)  01/28/18 198 lb (89.8 kg)  GEN: NAD, appears stated age, doesn't appear chronically ill PSYCH: Cooperative, without pressured speech EYE: Conjunctivae pink, sclerae anicteric ENT: MMM, without oral ulcers, no erythema or exudates noted NECK: Supple CV: RR without R/Gs  RESP: CTAB posteriorly, without wheezing GI: NABS, soft, NT/ND, without rebound or guarding, surgical scars present, no HSM appreciated MSK/EXT: No LE edema SKIN: No jaundice, no spider angiomata NEURO:  Alert & Oriented x 3, no focal deficits   REVIEW OF DATA  I reviewed the following data at the time of this encounter:  GI Procedures and Studies  10/19 Colonoscopy - Non-thrombosed external hemorrhoids found on digital rectal exam. - The examined portion of the ileum  was normal. Biopsied. - Diverticulosis in the recto-sigmoid colon, in the  sigmoid colon and in the descending colon. - Erythematous mucosa in the recto-sigmoid colon and in the sigmoid colon. Biopsied to rule out SCAD. - Normal mucosa in the entire examined colon otherwise. Biopsied for Microscopic colitis evaluation. - Non-bleeding non-thrombosed external and internal hemorrhoids.  Laboratory Studies  Reviewed in [facility] & CareEverywhere  Imaging Studies  6/19 CTAP CareEverywhere IMPRESSION: 1. Acute diverticulitis at the junction of the descending and sigmoid colon with moderate surrounding mesenteric edema. 2. Although likely inflammatory, strictly speaking the wall thickening in the area of diverticulitis could be due to underlying tumor. Correlation with the patient's colon cancer screening history is recommended. If screening is not up-to-date, appropriate screening should be considered following resolution of the patient's symptoms. 3. I suspect a partially un-wrapped fundoplication below the diaphragm. There is S a small hiatal hernia extending above the diaphragm. 4. Aortic Atherosclerosis (ICD10-I70.0). 5. Degenerative disc disease at L4-5 without observed impingement.  10/18 Esophagram CareEverywhere IMPRESSION: Status post hiatal hernia repair. An outpouching off the fundus of the stomach to the left is identified. Contrast passes beyond this outpouching but the patient's barium tablet did enter it. Tiny linear outpouching off the right aspect of the stomach is also identified. Based on correlation with prior CT scans, these outpouching are unchanged.  1/19 HIDA IMPRESSION: Normal study.  Normal gallbladder ejection fraction.  12/18 RUQUS IMPRESSION: Normal right upper quadrant abdominal ultrasound examination. If there are clinical concerns of chronic gallbladder dysfunction, a nuclear medicine hepatobiliary scan with gallbladder ejection fraction  determination may be useful.  9/18 CTAP CareEverywhere IMPRESSION: No CT findings to account for the patient's left abdominal pain.  No evidence of bowel obstruction. Appendix is not discretely visualized.  Left colonic diverticulosis, without evidence of diverticulitis.  Postsurgical changes at the GE junction, likely related to prior Nissen fundoplication. Associated mild fluid/contrast in the distal esophagus.  3/18 CTAP CareEverywhere 1. Mild disc disease at L4-5 and L3-4 could contribute to radicular symptoms. 2. Subtle inflammatory changes are present in the proximal sigmoid colon suggesting early changes of diverticulitis without complication. 3. Atherosclerosis. 4. Hiatal hernia with previous attempted repair. 5. Punctate nonobstructing stone in the right kidney. 6. Moderate stool within the ascending and proximal transverse colon without obstruction.  12/17 CTAP CareEverywhere IMPRESSION: Stable postsurgical changes related to Nissen fundoplication with hiatal hernia repair. Small sliding hiatal hernia persists.  No evidence of bowel obstruction. Appendix is not discretely visualized and may be surgically absent.  Sigmoid diverticulosis, without evidence of diverticulitis.   ASSESSMENT  Mr. Conchas is a 62 y.o. male with a pmh significant for CRI, OA, Recurrent Diverticulitis, HTN, GERD, Hiatal Hernia s/p Repair and Nissen with 2 redo surgeries, OSA, Chronic Diarrhea NOS.  The patient is seen today for evaluation and management of:  1. Chronic diarrhea   2. History of diverticulitis   3. Recurrent Hiatal hernia s/p redo Nissen and hiatal hernia closure in 2015 and then redo in 2017   4. Gastroesophageal reflux disease, esophagitis presence not specified   5. Esophageal dysphagia   6. Abnormal CT of the abdomen    The patient's underlying etiology of his chronic diarrhea is not clear.  It is without question however, that he has had recurrent diverticular  disease and needs to have a resection at some point.  In the setting of things, we will try to workup his chronic diarrhea as systematically as possible, but I suspect that he likely has an underlying functional diarrhea and we may not get a definitive  diagnosis.  I think with the reported history of SIBO and not being confirmed for eradication and only a single treatment that he needs to be rescreened.  If positive will repeat attempt at treating.  He is currently at higher risk than prior since he has been on/off antibiotics for now 2-years, though that does not explain the prior 2-years of diarrhea.  Will consider role of 72-hour stool studies, but I would only do that if things persisted after a surgical resection occurred.  He does not have the typical malabsorptive effects of a diarrhea that has lasted for 4-5 years with his current weight.  SCAD seems less likely with biopsies not showing inflammation but I would not necessarily put patient on Mesalamine products currently.  All patient questions were answered, to the best of my ability, and the patient agrees to the aforementioned plan of action with follow-up as indicated.   PLAN  1. Chronic diarrhea - TSH; Future - B12 and Folate Panel; Future - Tissue transglutaminase, IgA; Future - IgA; Future - Stool culture; Future - Ova and parasite examination; Future - Clostridium difficile Toxin B, Qualitative, Real-Time PCR; Future - Gastrointestinal Pathogen Panel PCR; Future - Fecal Lactoferrin - Pancreatic elastase, fecal; Future - Other/Misc lab test; Future - Cortisol; Future - SIBO breat testing to be performed  2. History of diverticulitis - Needs surgery at some point (TBD by surgical colleagues)  3. Recurrent Hiatal hernia s/p redo Nissen and hiatal hernia closure in 2015 and then redo in 2017 - Will consider EGD at some point to follow this up and if Dysphagia recurs  4. Gastroesophageal reflux disease, esophagitis presence not  specified  5. Esophageal dysphagia  6. Abnormal CT of the abdomen   Orders Placed This Encounter  Procedures  . Stool culture  . Ova and parasite examination  . TSH  . B12 and Folate Panel  . Tissue transglutaminase, IgA  . IgA  . Clostridium difficile Toxin B, Qualitative, Real-Time PCR  . Gastrointestinal Pathogen Panel PCR  . Fecal Lactoferrin  . Pancreatic elastase, fecal  . Other/Misc lab test  . Cortisol    New Prescriptions   No medications on file   Modified Medications   No medications on file    Planned Follow Up: No follow-ups on file.   Justice Britain, MD [facility] Office # [phone]

## 2018-02-12 NOTE — Patient Instructions (Addendum)
Normal BMI (Body Mass Index- based on height and weight) is between 19 and 25. Your BMI today is Body mass index is 25.9 kg/m. Marland Kitchen Please consider follow up  regarding your BMI with your Primary Care Provider.

## 2018-02-13 ENCOUNTER — Telehealth: Payer: Self-pay

## 2018-02-13 LAB — TISSUE TRANSGLUTAMINASE, IGA: (tTG) Ab, IgA: 1 U/mL

## 2018-02-13 NOTE — Telephone Encounter (Signed)
-----   Message from Lemar Lofty., MD sent at 02/12/2018 12:50 AM EDT ----- Regarding: RE: Chronic diarrhea Dear [facility] Ohm, Thanks for follow up. Since he came as direct to procedure and had the documented multiple episodes of diverticulitis I did not go into greater depth with his symptoms. Based on no findings in the underlying colon and the ileum and no evidence of inflammation, he may well have an underlying functional diarrhea. I don't disagree that if he has underlying disease of some other sort that he may not have complete improvement in his symptoms with sigmoidectomy, but I agree keeping that area in longer than necessary and recurrent bouts of diverticulitis is not ideal either. We are happy to see if other issues, but if this is as long-standing as you are documenting, then we can send stool cultures and rule out Celiac as well as SIBO, but if things persist I wouldn't necessarily hold up sigmoidectomy to do long stool studies or fecal fat, but we are happy to see him for follow up and discuss from there. All the best. Rami Waddle, let's see if we can get patient in for a follow up with me or APP in next 3-weeks. Gabe ----- Message ----- From: Andria Meuse, MD Sent: 02/10/2018   1:57 PM EDT To: Lemar Lofty., MD Subject: Chronic diarrhea                               Augustin Coupe -  I hope all is well. I saw Mr. Okazaki back whom you had scoped. He has had potentially 8 prior uncomplicated attacks of diverticulitis.  I saw your scope and biopsies - thank you for doing all of that. He has a prolonged history of diarrhea - multiple watery BMs per day and has had AKI develop from it in the past. He struggles with hydration at baseline. He stated his diarrheal symptoms pre-dated any diverticular symptoms. I wanted to reach out to you and see if there was anything you'd do in evaluation of it... I told him I was skeptical that his diverticular sxs were driving this problem and  that a sigmoidectomy up front may actually worsen his diarrhea (but small chance it would actually help if it was from mucositis from diverticulitis). I do think ultimately he would benefit from sigmoidectomy but wanted to hopefully have his diarrheal issues under better control before jumping in. He was scheduling follow-up with your office as well but I told him I'd send you as message as well.  [facility]

## 2018-02-13 NOTE — Telephone Encounter (Signed)
The pt was seen on 10/30 with Dr Meridee Score

## 2018-02-14 ENCOUNTER — Encounter: Payer: Self-pay | Admitting: Gastroenterology

## 2018-02-14 DIAGNOSIS — K529 Noninfective gastroenteritis and colitis, unspecified: Secondary | ICD-10-CM | POA: Insufficient documentation

## 2018-02-14 DIAGNOSIS — R1319 Other dysphagia: Secondary | ICD-10-CM | POA: Insufficient documentation

## 2018-02-14 DIAGNOSIS — R1310 Dysphagia, unspecified: Secondary | ICD-10-CM | POA: Insufficient documentation

## 2018-02-14 DIAGNOSIS — Z8719 Personal history of other diseases of the digestive system: Secondary | ICD-10-CM | POA: Insufficient documentation

## 2018-03-07 ENCOUNTER — Encounter: Payer: Self-pay | Admitting: Gastroenterology

## 2018-03-07 NOTE — Progress Notes (Signed)
Documentation of outside records from [facility]  April 2019 evaluation for thoracic radiculopathy Patient had injection into the T9-10 interspace  July 2019 follow-up for back issues.  No noted thoracic and lumbar disc disease  August 2019 labs Sodium 138 Potassium 3.6 BUN 13 Glucose 147 Calcium 9.1 Phosphorus 3.7 Creatinine 1.53   August 2019 clinic visit for evaluation of back pain  August 2019 clinic visit Evaluation for multiple medical problems Assessment evaluation for hypertension, metabolic bone disease, vitamin D deficiency, hypokalemia,  September 2019 clinic follow-up for back pain  September 2019 clinic visit for right ring finger pain for a year  EKGs noted  MR spine without contrast  Further EKGs noted  PCP follow-up in January 15, 2018 Was having cerumen impaction which was cleared from his right ear He was to follow-up with with still persistent discomfort and work-up ongoing as well as therapy He was to follow-up with surgery as well as his orthopedic surgeons. And return in 6 months for complete physical exam   February 07, 2018 urinalysis grossly negative  February 10, 2018 labs Sodium 139 Potassium 4.4 BUN 12 Creatinine 1.3  Follow-up for examination of mass excision from right ring finger   January 15, 2018 labs Magnesium two-point LDL 132 Total cholesterol 161205 HDL 56 TSH 1.48 PSA 0.17 January 2018 follow-up for back pain  These notes will be scanned into the chart.   Corliss ParishGabriel Mansouraty, MD [facility] Office # [phone]

## 2018-05-25 IMAGING — NM NM HEPATO W/GB/PHARM/[PERSON_NAME]
2 series · 12 of 12 positions shown · non-contrast
Comparison: None.

CLINICAL DATA: Upper abdominal pain.

EXAM:
NUCLEAR MEDICINE HEPATOBILIARY IMAGING WITH GALLBLADDER EF
TECHNIQUE: Sequential images of the abdomen were obtained [DATE] minutes
following intravenous administration of radiopharmaceutical. After
oral ingestion of Ensure, gallbladder ejection fraction was
determined. At 60 min, normal ejection fraction is greater than 33%.
RADIOPHARMACEUTICALS:  5.443 mCi 8c-BBm  Choletec IV

[he hepatobiliary · 3.10mm/px · 6 of 60 frames shown (1 of 2)]
[frame 6/60]
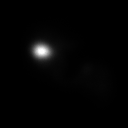
[frame 16/60]
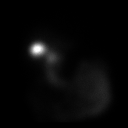
[frame 26/60]
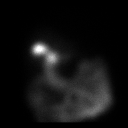
[frame 36/60]
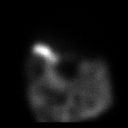
[frame 46/60]
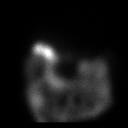
[frame 56/60]
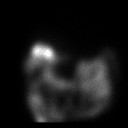

[he hepatobiliary · 3.10mm/px · 6 of 60 frames shown (2 of 2)]
[frame 6/60]
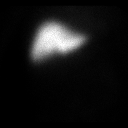
[frame 16/60]
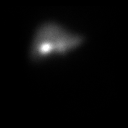
[frame 26/60]
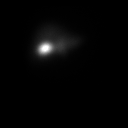
[frame 36/60]
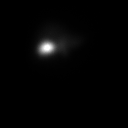
[frame 46/60]
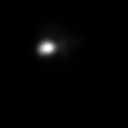
[frame 56/60]
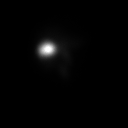

[12 of 12 positions shown; findings below may reference images not displayed]

FINDINGS: Prompt uptake and biliary excretion of activity by the liver is
seen. Gallbladder activity is visualized, consistent with patency of
cystic duct. Biliary activity passes into small bowel, consistent
with patent common bile duct.

Calculated gallbladder ejection fraction is 81%. (Normal gallbladder
ejection fraction with Ensure is greater than 33%.)
IMPRESSION: Normal study.  Normal gallbladder ejection fraction.

## 2019-11-10 IMAGING — US US ABDOMEN LIMITED
1 series · 14 of 25 positions shown · non-contrast
Comparison: Abdominal and pelvic CT scan January 03, 2017.

CLINICAL DATA: Two months of right upper quadrant abdominal pain

EXAM:
ULTRASOUND ABDOMEN LIMITED RIGHT UPPER QUADRANT

[Series 1: us abdomen limited · 0.19mm/px · 14 of 44 slices shown]
[im 1/44]
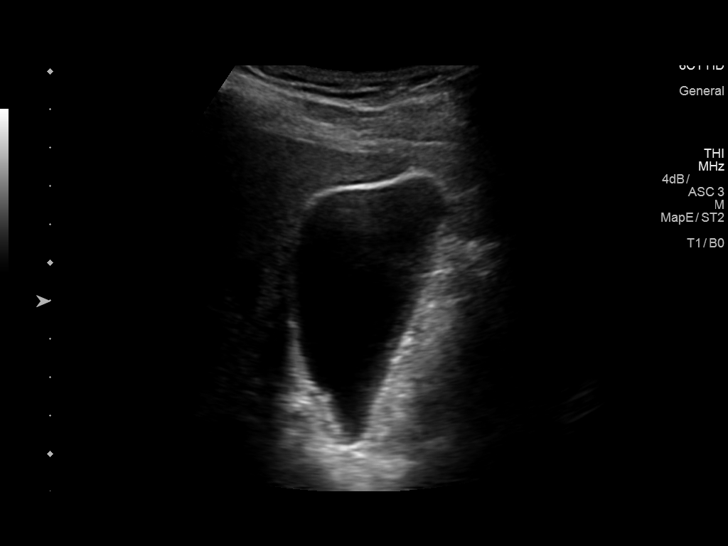
[im 4/44]
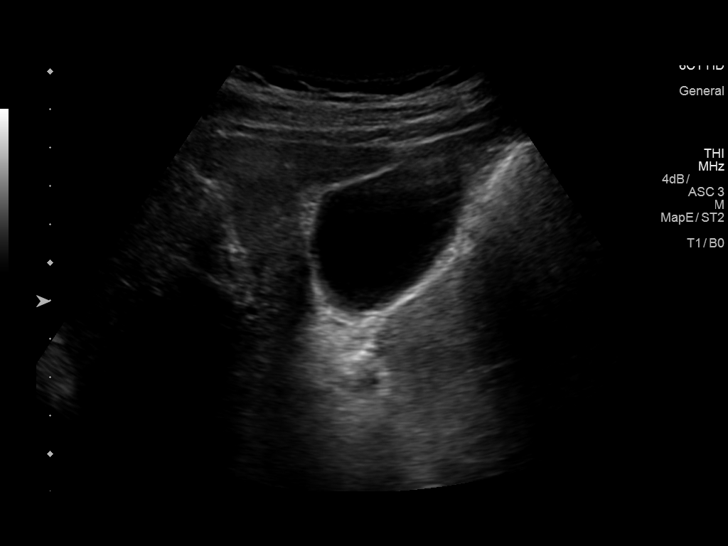
[im 8/44]
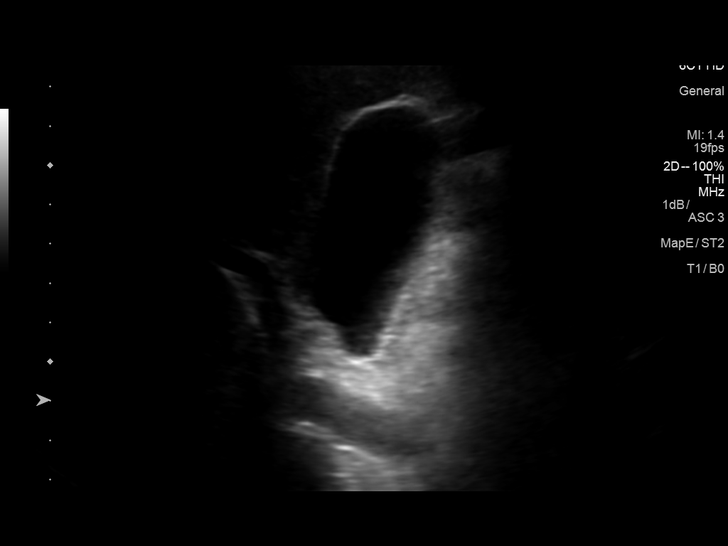
[im 11/44]
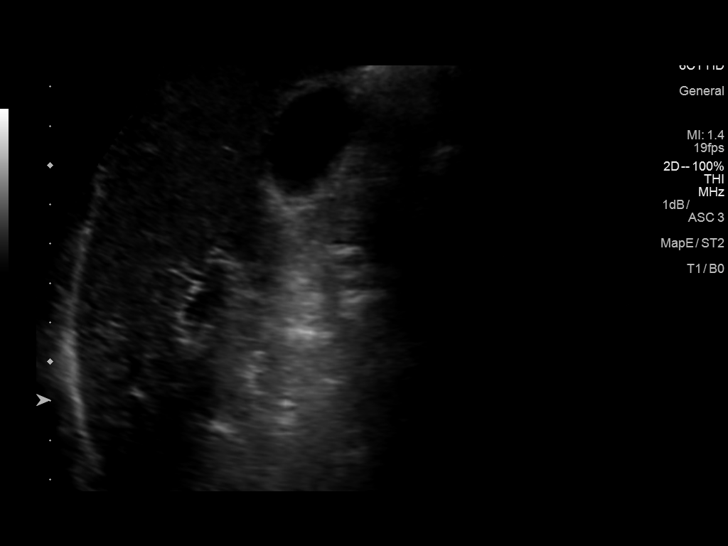
[im 15/44]
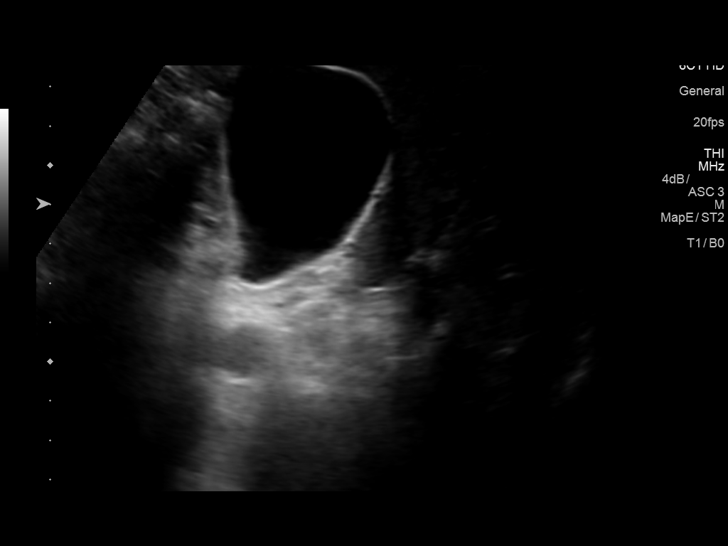
[im 17/44]
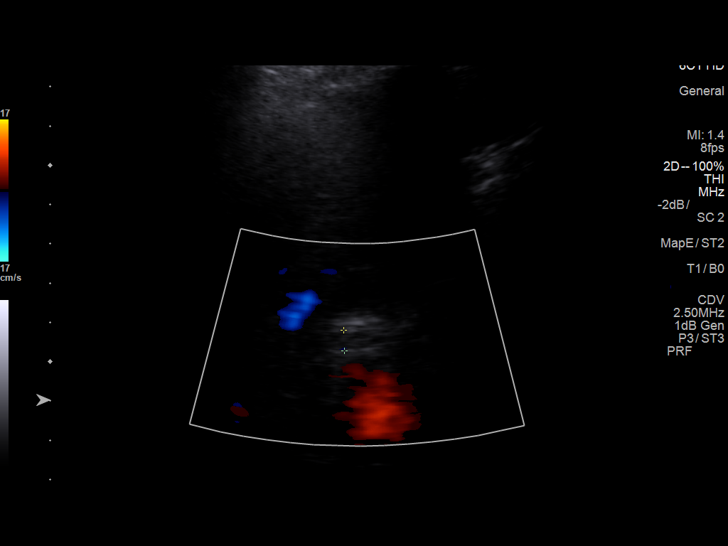
[im 20/44]
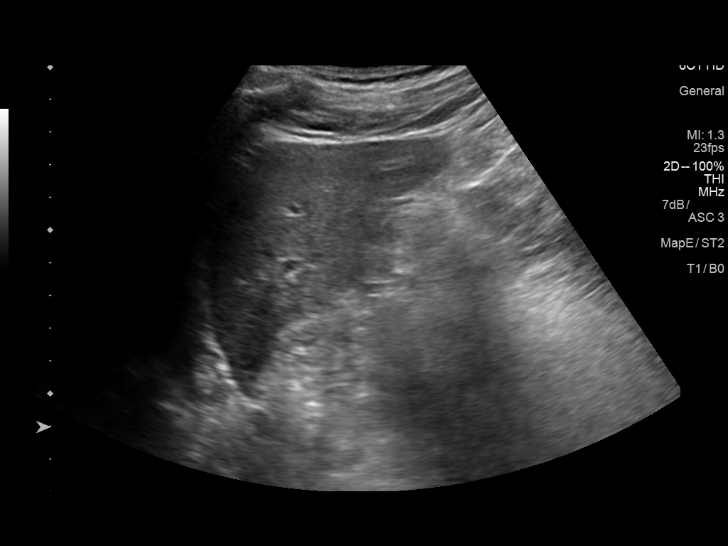
[im 24/44]
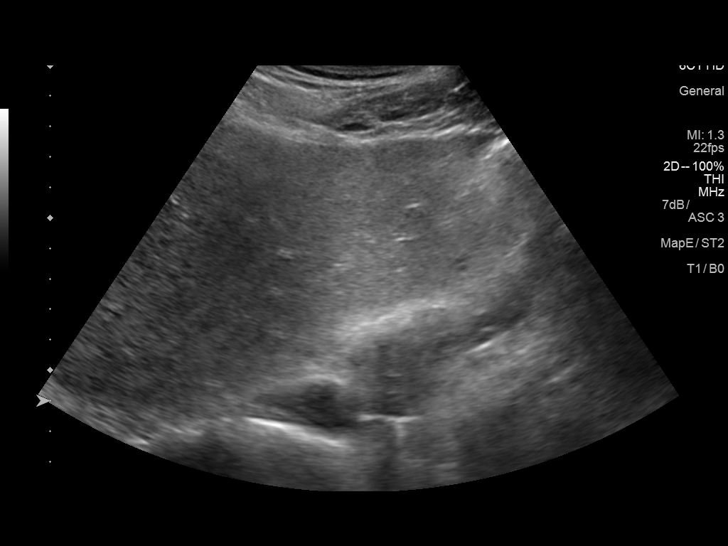
[im 27/44]
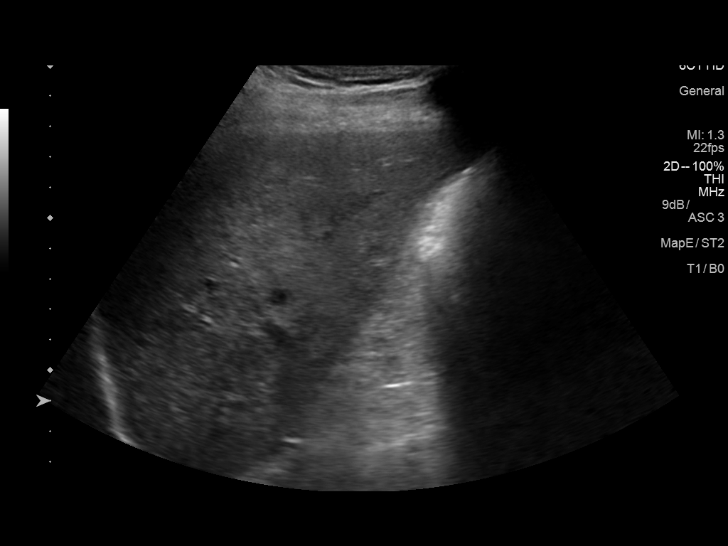
[im 29/44]
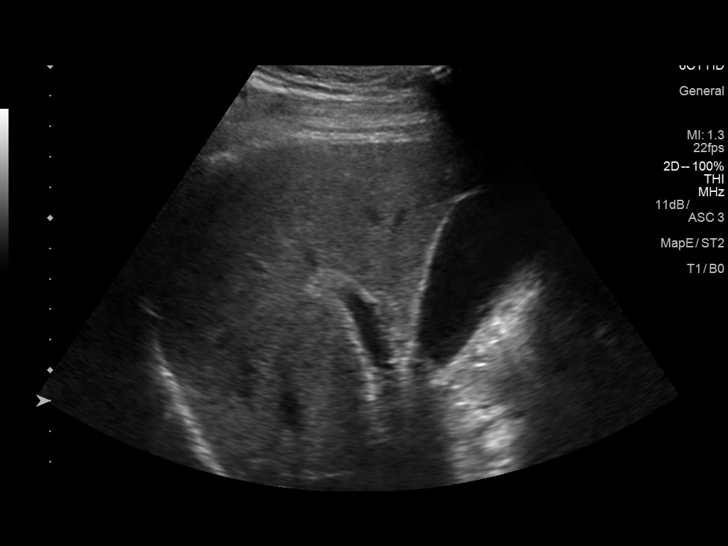
[im 33/44]
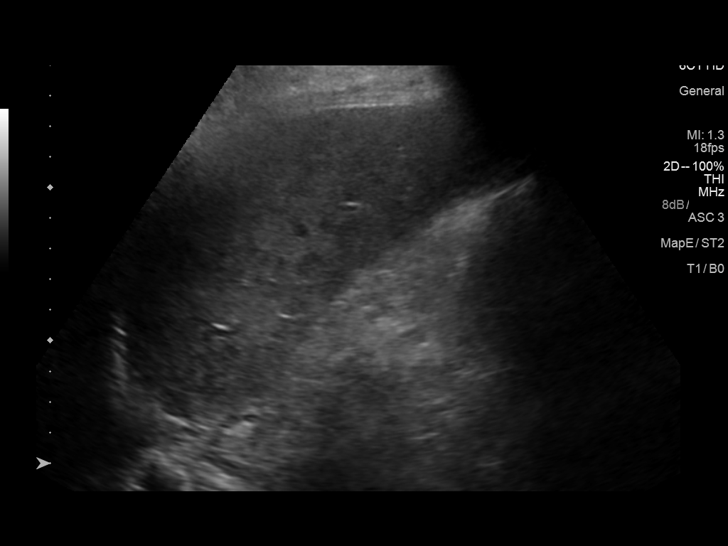
[im 36/44]
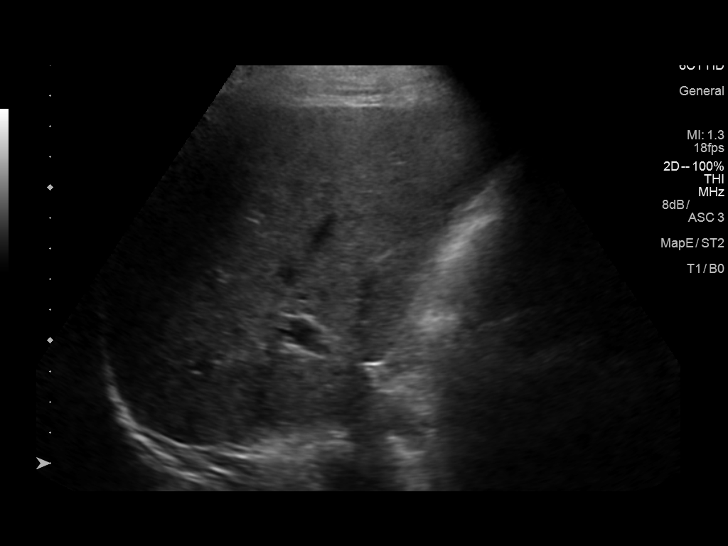
[im 40/44]
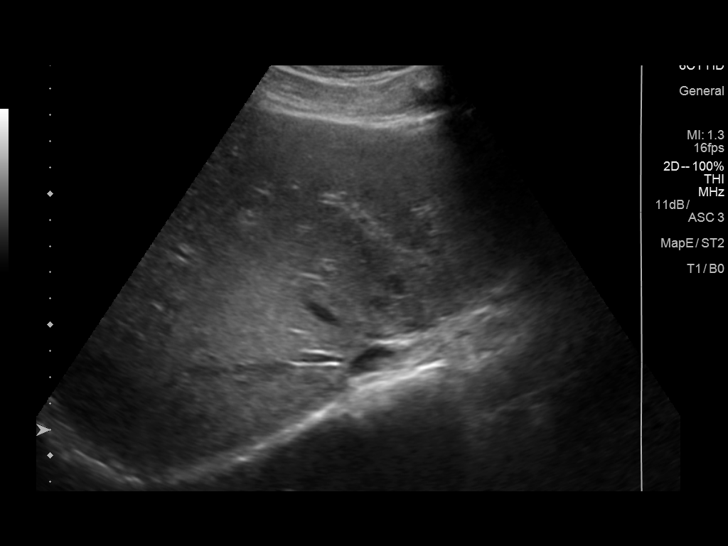
[im 44/44]
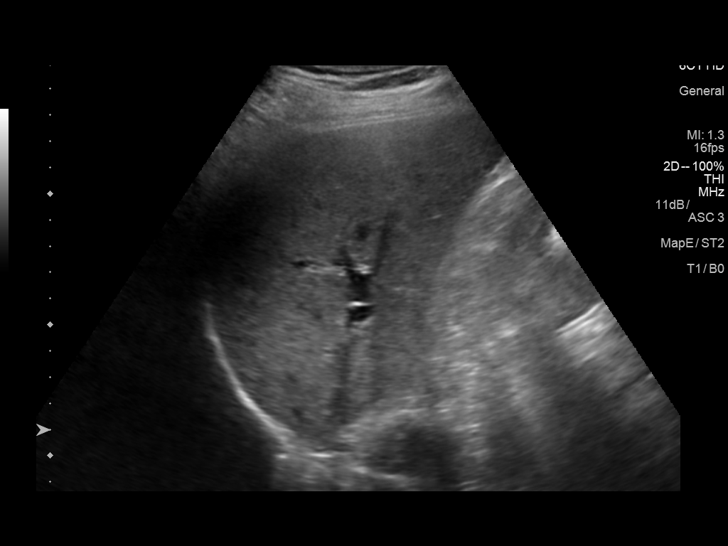

[14 of 25 positions shown; findings below may reference images not displayed]

FINDINGS: Gallbladder:

No gallstones or wall thickening visualized. No sonographic Murphy
sign noted by sonographer.

Common bile duct:

Diameter: 5.3 mm

Liver:

No focal lesion identified. Within normal limits in parenchymal
echogenicity. Portal vein is patent on color Doppler imaging with
normal direction of blood flow towards the liver.
IMPRESSION: Normal right upper quadrant abdominal ultrasound examination. If
there are clinical concerns of chronic gallbladder dysfunction, a
nuclear medicine hepatobiliary scan with gallbladder ejection
fraction determination may be useful.

## 2022-06-03 ENCOUNTER — Emergency Department (HOSPITAL_BASED_OUTPATIENT_CLINIC_OR_DEPARTMENT_OTHER)
Admission: EM | Admit: 2022-06-03 | Discharge: 2022-06-03 | Disposition: A | Discharge status: Home / Self Care | Payer: Medicare HMO | Attending: Emergency Medicine | Admitting: Emergency Medicine

## 2022-06-03 ENCOUNTER — Encounter (HOSPITAL_BASED_OUTPATIENT_CLINIC_OR_DEPARTMENT_OTHER): Payer: Self-pay | Admitting: Emergency Medicine

## 2022-06-03 ENCOUNTER — Emergency Department (HOSPITAL_BASED_OUTPATIENT_CLINIC_OR_DEPARTMENT_OTHER): Payer: Medicare HMO

## 2022-06-03 ENCOUNTER — Other Ambulatory Visit: Payer: Self-pay

## 2022-06-03 DIAGNOSIS — K439 Ventral hernia without obstruction or gangrene: Secondary | ICD-10-CM | POA: Diagnosis not present

## 2022-06-03 DIAGNOSIS — N189 Chronic kidney disease, unspecified: Secondary | ICD-10-CM | POA: Diagnosis not present

## 2022-06-03 DIAGNOSIS — R1084 Generalized abdominal pain: Secondary | ICD-10-CM | POA: Diagnosis not present

## 2022-06-03 DIAGNOSIS — R109 Unspecified abdominal pain: Secondary | ICD-10-CM | POA: Diagnosis present

## 2022-06-03 LAB — COMPREHENSIVE METABOLIC PANEL WITH GFR
ALT: 27 U/L (ref 0–44)
AST: 32 U/L (ref 15–41)
Albumin: 4.3 g/dL (ref 3.5–5.0)
Alkaline Phosphatase: 80 U/L (ref 38–126)
Anion gap: 8 (ref 5–15)
BUN: 12 mg/dL (ref 8–23)
CO2: 27 mmol/L (ref 22–32)
Calcium: 8.9 mg/dL (ref 8.9–10.3)
Chloride: 100 mmol/L (ref 98–111)
Creatinine, Ser: 1.22 mg/dL (ref 0.61–1.24)
GFR, Estimated: >60 mL/min
Glucose, Bld: 121 mg/dL — ABNORMAL HIGH (ref 70–99)
Potassium: 3.1 mmol/L — ABNORMAL LOW (ref 3.5–5.1)
Sodium: 135 mmol/L (ref 135–145)
Total Bilirubin: 1.1 mg/dL (ref 0.3–1.2)
Total Protein: 7.2 g/dL (ref 6.5–8.1)

## 2022-06-03 LAB — URINALYSIS, ROUTINE W REFLEX MICROSCOPIC
Bilirubin Urine: NEGATIVE
Glucose, UA: NEGATIVE mg/dL
Hgb urine dipstick: NEGATIVE
Ketones, ur: NEGATIVE mg/dL
Leukocytes,Ua: NEGATIVE
Nitrite: NEGATIVE
Protein, ur: NEGATIVE mg/dL
Specific Gravity, Urine: 1.015 (ref 1.005–1.030)
pH: 7 (ref 5.0–8.0)

## 2022-06-03 LAB — CBC WITH DIFFERENTIAL/PLATELET
Abs Immature Granulocytes: 0.01 K/uL (ref 0.00–0.07)
Basophils Absolute: 0.1 K/uL (ref 0.0–0.1)
Basophils Relative: 1 %
Eosinophils Absolute: 0.1 K/uL (ref 0.0–0.5)
Eosinophils Relative: 2 %
HCT: 43.4 % (ref 39.0–52.0)
Hemoglobin: 15.3 g/dL (ref 13.0–17.0)
Immature Granulocytes: 0 %
Lymphocytes Relative: 27 %
Lymphs Abs: 1.3 K/uL (ref 0.7–4.0)
MCH: 31.4 pg (ref 26.0–34.0)
MCHC: 35.3 g/dL (ref 30.0–36.0)
MCV: 88.9 fL (ref 80.0–100.0)
Monocytes Absolute: 0.4 K/uL (ref 0.1–1.0)
Monocytes Relative: 9 %
Neutro Abs: 2.9 K/uL (ref 1.7–7.7)
Neutrophils Relative %: 61 %
Platelets: 207 K/uL (ref 150–400)
RBC: 4.88 MIL/uL (ref 4.22–5.81)
RDW: 12 % (ref 11.5–15.5)
WBC: 4.8 K/uL (ref 4.0–10.5)
nRBC: 0 % (ref 0.0–0.2)

## 2022-06-03 LAB — LIPASE, BLOOD: Lipase: 37 U/L (ref 11–51)

## 2022-06-03 MED ORDER — SODIUM CHLORIDE 0.9 % IV BOLUS
500.0000 mL | Freq: Once | INTRAVENOUS | Status: AC
  Administered 2022-06-03: 500 mL via INTRAVENOUS

## 2022-06-03 MED ORDER — POTASSIUM CHLORIDE CRYS ER 20 MEQ PO TBCR
40.0000 meq | EXTENDED_RELEASE_TABLET | Freq: Once | ORAL | Status: AC
  Administered 2022-06-03: 40 meq via ORAL
  Filled 2022-06-03: qty 2

## 2022-06-03 MED ORDER — ONDANSETRON HCL 4 MG/2ML IJ SOLN
4.0000 mg | Freq: Once | INTRAMUSCULAR | Status: AC
  Administered 2022-06-03: 4 mg via INTRAVENOUS
  Filled 2022-06-03: qty 2

## 2022-06-03 MED ORDER — IOHEXOL 300 MG/ML  SOLN
100.0000 mL | Freq: Once | INTRAMUSCULAR | Status: AC | PRN
  Administered 2022-06-03: 100 mL via INTRAVENOUS

## 2022-06-03 NOTE — ED Provider Notes (Signed)
[facility] Provider Note   CSN: [id number] Arrival date & time: 06/03/22  1244     History  Chief Complaint  Patient presents with   Abdominal Pain    Eric Rollins is a 67 y.o. male.  67 year old male presents with complaint of abdominal pain with nausea, vomiting, loose stools.  Reports onset of symptoms 1 week ago, reports stools are loose and tan, more formed today although still a very light tan color, nonbloody.  Reports pain in his abdomen is worse with eating, feels like his whole abdomen becomes firm and distended.  Denies fevers or chills.  States his urine was dark however is becoming lighter.  Significant abdominal history including partial colectomy for diverticulitis, admitted to the hospital 1 year ago for kidney infection/sepsis.  During his admission there was concern that his intra-abdominal site had come undone, he had additional abdominal surgery with placement of a mesh, accidental damage to the mesh resulting in ventral hernia which is painful at times.  Also history of sludge in his gallbladder. Additional abdominal surgeries include laparoscopic nisin fundoplication.       Home Medications Prior to Admission medications   Medication Sig Start Date End Date Taking? Authorizing Provider  amLODipine (NORVASC) 10 MG tablet Take 10 mg by mouth daily.    [provider]  Artificial Saliva (BIOTENE ORALBALANCE DRY MOUTH) LIQD Use as directed in the mouth or throat See admin instructions. He uses one spray at bedtime.    [provider]  cholecalciferol (VITAMIN D) 1000 units tablet Take 1,000 Units by mouth daily.    [provider]  diazepam (VALIUM) 5 MG tablet Take 5 mg by mouth every 6 (six) hours as needed for anxiety.    [provider]  diphenoxylate-atropine (LOMOTIL) 2.5-0.025 MG tablet TAKE 2 TABLETS BY MOUTH IN THE MORNING AND 1 AFTER EACH LOOSE STOOL (NOT MORE THAN 6 TABS PER  DAY) 08/22/17   [provider]  esomeprazole (NEXIUM) 40 MG capsule Take by mouth.    [provider]  HYDROcodone-acetaminophen (NORCO/VICODIN) 5-325 MG tablet Take 1 tablet by mouth every 6 (six) hours as needed for moderate pain.    [provider]  metoprolol succinate (TOPROL-XL) 100 MG 24 hr tablet Take 100 mg by mouth every morning.  08/11/13   [provider]  sildenafil (REVATIO) 20 MG tablet TAKE 3 TO 5 TABLETS BY MOUTH 30 MINUTES TO 1 HOUR PRIOR TO INTERCOURSE AS DIRECTED 10/09/16   [provider]  tiZANidine (ZANAFLEX) 4 MG tablet Take 4 mg by mouth every 6 (six) hours as needed for muscle spasms.    [provider]  traMADol (ULTRAM) 50 MG tablet Take by mouth every 6 (six) hours as needed.    [provider]  triamterene-hydrochlorothiazide (MAXZIDE) 75-50 MG tablet TAKE 1/2 TABLET BY MOUTH EVERY DAY 01/15/18   [provider]  vitamin B-12 (CYANOCOBALAMIN) 1000 MCG tablet Take 1,000 mcg by mouth daily.    [provider]      Allergies    Lisinopril, Oxycodone, Promethazine, and Penicillins    Review of Systems   Review of Systems Negative except as per HPI Physical Exam Updated Vital Signs BP 136/89   Pulse 64   Temp 97.7 F (36.5 C) (Oral)   Resp 18   Ht 6' (1.829 m)   Wt 83 kg   SpO2 100%   BMI 24.82 kg/m  Physical Exam Vitals and nursing note  reviewed.  Constitutional:      General: He is not in acute distress.    Appearance: He is well-developed. He is not diaphoretic.  HENT:     Head: Normocephalic and atraumatic.  Cardiovascular:     Rate and Rhythm: Normal rate and regular rhythm.     Heart sounds: Normal heart sounds.  Pulmonary:     Effort: Pulmonary effort is normal.     Breath sounds: Normal breath sounds.  Abdominal:     General: A surgical scar is present.     Palpations: Abdomen is soft.     Tenderness: There is abdominal tenderness.     Hernia: A hernia is  present. Hernia is present in the ventral area.     Comments: Ventral hernia is mildly tender, resolves with lying supine  Skin:    General: Skin is warm and dry.     Findings: No erythema or rash.  Neurological:     Mental Status: He is alert and oriented to person, place, and time.  Psychiatric:        Behavior: Behavior normal.     ED Results / Procedures / Treatments   Labs (all labs ordered are listed, but only abnormal results are displayed) Labs Reviewed  COMPREHENSIVE METABOLIC PANEL - Abnormal; Notable for the following components:      Result Value   Potassium 3.1 (*)    Glucose, Bld 121 (*)    All other components within normal limits  CBC WITH DIFFERENTIAL/PLATELET  LIPASE, BLOOD  URINALYSIS, ROUTINE W REFLEX MICROSCOPIC    EKG None  Radiology CT Abdomen Pelvis W Contrast  Result Date: 06/03/2022 CLINICAL DATA:  Abdominal pain and nausea EXAM: CT ABDOMEN AND PELVIS WITH CONTRAST TECHNIQUE: Multidetector CT imaging of the abdomen and pelvis was performed using the standard protocol following bolus administration of intravenous contrast. RADIATION DOSE REDUCTION: This exam was performed according to the departmental dose-optimization program which includes automated exposure control, adjustment of the mA and/or kV according to patient size and/or use of iterative reconstruction technique. CONTRAST:  161m OMNIPAQUE IOHEXOL 300 MG/ML  SOLN COMPARISON:  None Available. FINDINGS: Lower chest: Lung bases are clear. Hepatobiliary: No focal hepatic lesion. Normal gallbladder. No biliary duct dilatation. Common bile duct is normal. Pancreas: Pancreas is normal. No ductal dilatation. No pancreatic inflammation. Spleen: Normal spleen Adrenals/urinary tract: Adrenal glands and kidneys are normal. The ureters and bladder normal. Stomach/Bowel: Small hiatal hernia. Postsurgical change consistent with fundoplication. Stomach, small-bowel and cecum are normal. The appendix is not  identified but there is no pericecal inflammation to suggest appendicitis. Bowel anastomosis in the proximal sigmoid colon. No obstruction or inflammation. Vascular/Lymphatic: Abdominal aorta is normal caliber with atherosclerotic calcification. There is no retroperitoneal or periportal lymphadenopathy. No pelvic lymphadenopathy. Reproductive: Prostate normal Other: No free fluid. Musculoskeletal: No aggressive osseous lesion. IMPRESSION: 1. No acute findings in the abdomen pelvis. 2. Small hiatal hernia. 3. Bowel anastomosis in the proximal sigmoid colon without complication. 4.  Aortic Atherosclerosis (ICD10-I70.0). Electronically Signed   By: SSuzy BouchardM.D.   On: 06/03/2022 15:35    Procedures Procedures    Medications Ordered in ED Medications  potassium chloride SA (KLOR-CON M) CR tablet 40 mEq (has no administration in time range)  sodium chloride 0.9 % bolus 500 mL ( Intravenous Stopped 06/03/22 1602)  ondansetron (ZOFRAN) injection 4 mg (4 mg Intravenous Given 06/03/22 1445)  iohexol (OMNIPAQUE) 300 MG/ML solution 100 mL (100 mLs Intravenous Contrast Given 06/03/22 1449)  ED Course/ Medical Decision Making/ A&P                             Medical Decision Making Amount and/or Complexity of Data Reviewed Labs: ordered. Radiology: ordered.  Risk Prescription drug management.   This patient presents to the ED for concern of abdominal pain, this involves an extensive number of treatment options, and is a complaint that carries with it a high risk of complications and morbidity.  The differential diagnosis includes but not limited to PUD, gastritis, gastroenteritis, SBO, acute cholecystitis, incarcerated hernia   Co morbidities that complicate the patient evaluation  Hiatal hernia s/p nissen and closure (x 3 surgeries), GERD (on Protonix), CKD, diverticulitis (with partial colectomy)    Additional history obtained:  External records from outside source obtained and  reviewed including prior labs on file for comparison including labs completed at [place] [facility] 06/01/22 (eloped before work up completed)   Lab Tests:  I Ordered, and personally interpreted labs.  The pertinent results include:  CBC WNL, UA normal, lipase WNL, CMP with mild hypokalemia likely related to recent vomiting and diarrhea    Imaging Studies ordered:  I ordered imaging studies including CT a/p with PO and IV contrast  I independently visualized and interpreted imaging which showed no acute abnormality, no free air I agree with the radiologist interpretation   Problem List / ED Course / Critical interventions / Medication management  67 year old male with complaint of abdominal pain, bloating and distention following GI illness with vomiting and diarrhea with symptoms onset 1 week ago.  On exam, has mild generalized abdominal discomfort, ventral hernia which is mildly tender but easily reducible.  Labs are reassuring, does have mild hypokalemia likely due to recent GI illness, will replace with oral K-Dur prior to discharge.  CT abdomen pelvis obtained due to complex intra-abdominal history, exam is reassuring without acute findings.  Patient symptoms have been improving today however due to duration of illness and his light tan stools, presented to the ER.  Offered reassurance, encouraged to follow-up with GI and PCP, provided with referral to GI.  Recommend return to ER for worsening or concerning symptoms.  Recommend bland diet. I ordered medication including IVF, K-dur  for hydration, mild hypokalemia  Reevaluation of the patient after these medicines showed that the patient improved I have reviewed the patients home medicines and have made adjustments as needed   Social Determinants of Health:  Has PCP for follow up, is scheduled to see [facility] next month for ventral hernia   Test / Admission - Considered:  After comprehensive evaluation in ER, felt  stable for discharge to follow-up with PCP and GI.  Considered right upper quadrant ultrasound due to report of abdominal discomfort with eating however LFTs are normal, bili normal, alk phos normal, CT with no gallbladder abnormality.         Final Clinical Impression(s) / ED Diagnoses Final diagnoses:  Generalized abdominal pain    Rx / DC Orders ED Discharge Orders     None         Tacy Learn, PA-C 06/03/22 1618    Gareth Morgan, MD 06/03/22 2355

## 2022-06-03 NOTE — ED Triage Notes (Signed)
Patient c/o abdominal pain with nausea and vomiting x 1 week.  Pt also reports stool noted to be light tan in color.

## 2022-06-03 NOTE — Discharge Instructions (Addendum)
Follow-up with your primary care provider.  Consider follow-up with GI, referral provided. Bland diet, return to ER for worsening or concerning symptoms.

## 2022-07-03 ENCOUNTER — Other Ambulatory Visit: Payer: Self-pay

## 2022-07-03 DIAGNOSIS — K828 Other specified diseases of gallbladder: Secondary | ICD-10-CM

## 2022-07-03 DIAGNOSIS — K219 Gastro-esophageal reflux disease without esophagitis: Secondary | ICD-10-CM

## 2022-07-04 ENCOUNTER — Other Ambulatory Visit: Payer: Self-pay | Admitting: Surgery

## 2022-07-04 DIAGNOSIS — K219 Gastro-esophageal reflux disease without esophagitis: Secondary | ICD-10-CM

## 2022-07-04 DIAGNOSIS — K828 Other specified diseases of gallbladder: Secondary | ICD-10-CM

## 2022-07-18 ENCOUNTER — Other Ambulatory Visit: Payer: Self-pay | Admitting: Surgery

## 2022-07-18 DIAGNOSIS — K219 Gastro-esophageal reflux disease without esophagitis: Secondary | ICD-10-CM

## 2022-07-18 DIAGNOSIS — K828 Other specified diseases of gallbladder: Secondary | ICD-10-CM

## 2022-07-30 ENCOUNTER — Ambulatory Visit
Admission: RE | Admit: 2022-07-30 | Discharge: 2022-07-30 | Disposition: A | Discharge status: Home / Self Care | Payer: Medicare HMO | Source: Ambulatory Visit | Attending: Surgery | Admitting: Surgery

## 2022-07-30 DIAGNOSIS — K219 Gastro-esophageal reflux disease without esophagitis: Secondary | ICD-10-CM

## 2022-07-30 DIAGNOSIS — K828 Other specified diseases of gallbladder: Secondary | ICD-10-CM

## 2022-08-02 ENCOUNTER — Ambulatory Visit
Admission: RE | Admit: 2022-08-02 | Discharge: 2022-08-02 | Disposition: A | Discharge status: Home / Self Care | Payer: Medicare HMO | Source: Ambulatory Visit | Attending: Surgery | Admitting: Surgery

## 2022-08-02 DIAGNOSIS — K219 Gastro-esophageal reflux disease without esophagitis: Secondary | ICD-10-CM

## 2022-08-02 DIAGNOSIS — K828 Other specified diseases of gallbladder: Secondary | ICD-10-CM
